# Patient Record
Sex: Female | Born: 1985
Health system: Southern US, Community
[De-identification: ages and names within clinical notes are randomized; demographics above are authoritative.]

## PROBLEM LIST (undated history)

## (undated) DIAGNOSIS — N920 Excessive and frequent menstruation with regular cycle: Secondary | ICD-10-CM

## (undated) DIAGNOSIS — K219 Gastro-esophageal reflux disease without esophagitis: Secondary | ICD-10-CM

## (undated) DIAGNOSIS — N83209 Unspecified ovarian cyst, unspecified side: Secondary | ICD-10-CM

## (undated) DIAGNOSIS — D649 Anemia, unspecified: Secondary | ICD-10-CM

## (undated) DIAGNOSIS — D500 Iron deficiency anemia secondary to blood loss (chronic): Secondary | ICD-10-CM

## (undated) DIAGNOSIS — Z9289 Personal history of other medical treatment: Secondary | ICD-10-CM

## (undated) DIAGNOSIS — D259 Leiomyoma of uterus, unspecified: Secondary | ICD-10-CM

## (undated) HISTORY — DX: Gastro-esophageal reflux disease without esophagitis: K21.9

## (undated) HISTORY — DX: Iron deficiency anemia secondary to blood loss (chronic): D50.0

## (undated) HISTORY — DX: Leiomyoma of uterus, unspecified: D25.9

## (undated) HISTORY — DX: Anemia, unspecified: D64.9

## (undated) HISTORY — DX: Excessive and frequent menstruation with regular cycle: N92.0

---

## 2008-09-04 ENCOUNTER — Emergency Department (HOSPITAL_COMMUNITY): Admission: EM | Admit: 2008-09-04 | Discharge: 2008-09-04 | Payer: Self-pay | Admitting: Emergency Medicine

## 2010-11-27 ENCOUNTER — Emergency Department (HOSPITAL_COMMUNITY)
Admission: EM | Admit: 2010-11-27 | Discharge: 2010-11-27 | Payer: Self-pay | Source: Home / Self Care | Admitting: Family Medicine

## 2010-12-30 ENCOUNTER — Emergency Department (HOSPITAL_COMMUNITY)
Admission: EM | Admit: 2010-12-30 | Discharge: 2010-12-30 | Payer: Self-pay | Source: Home / Self Care | Admitting: Family Medicine

## 2010-12-31 LAB — POCT URINALYSIS DIPSTICK
Bilirubin Urine: NEGATIVE
Hgb urine dipstick: NEGATIVE
Ketones, ur: NEGATIVE mg/dL
Nitrite: NEGATIVE
Protein, ur: NEGATIVE mg/dL
Specific Gravity, Urine: 1.025 (ref 1.005–1.030)
Urine Glucose, Fasting: NEGATIVE mg/dL
Urobilinogen, UA: 1 mg/dL (ref 0.0–1.0)
pH: 7 (ref 5.0–8.0)

## 2010-12-31 LAB — POCT PREGNANCY, URINE: Preg Test, Ur: NEGATIVE

## 2011-02-16 LAB — POCT URINALYSIS DIPSTICK
Bilirubin Urine: NEGATIVE
Glucose, UA: NEGATIVE mg/dL
Specific Gravity, Urine: 1.025 (ref 1.005–1.030)

## 2015-10-20 ENCOUNTER — Encounter (HOSPITAL_COMMUNITY): Payer: Self-pay | Admitting: *Deleted

## 2015-10-20 ENCOUNTER — Emergency Department (INDEPENDENT_AMBULATORY_CARE_PROVIDER_SITE_OTHER)
Admission: EM | Admit: 2015-10-20 | Discharge: 2015-10-20 | Disposition: A | Discharge status: Home / Self Care | Payer: No Typology Code available for payment source | Source: Home / Self Care | Attending: Family Medicine | Admitting: Family Medicine

## 2015-10-20 DIAGNOSIS — N83202 Unspecified ovarian cyst, left side: Secondary | ICD-10-CM | POA: Diagnosis not present

## 2015-10-20 HISTORY — DX: Unspecified ovarian cyst, unspecified side: N83.209

## 2015-10-20 LAB — POCT URINALYSIS DIP (DEVICE)
Bilirubin Urine: NEGATIVE
GLUCOSE, UA: NEGATIVE mg/dL
Hgb urine dipstick: NEGATIVE
Ketones, ur: NEGATIVE mg/dL
Leukocytes, UA: NEGATIVE
NITRITE: NEGATIVE
PH: 7 (ref 5.0–8.0)
PROTEIN: NEGATIVE mg/dL
Specific Gravity, Urine: 1.02 (ref 1.005–1.030)
UROBILINOGEN UA: 1 mg/dL (ref 0.0–1.0)

## 2015-10-20 LAB — POCT PREGNANCY, URINE: PREG TEST UR: NEGATIVE

## 2015-10-20 MED ORDER — HYDROCODONE-ACETAMINOPHEN 5-325 MG PO TABS: 1.0000 | ORAL_TABLET | ORAL | Status: DC | PRN

## 2015-10-20 NOTE — Discharge Instructions (Signed)
Use heat on side and pain med as needed, [facility].

## 2015-10-20 NOTE — ED Notes (Signed)
l   Lower quadrant     Pain        Pain   X   3  Days          denys   Any           Pt  Reports         Discharge

## 2015-10-20 NOTE — ED Provider Notes (Signed)
CSN: [id number]     Arrival date & time 10/20/15  1303 History   First MD Initiated Contact with Patient 10/20/15 1321     Chief Complaint  Patient presents with  . Abdominal Pain   (Consider location/radiation/quality/duration/timing/severity/associated sxs/prior Treatment) Patient is a 29 y.o. female presenting with abdominal pain. The history is provided by the patient.  Abdominal Pain Pain location:  LLQ Pain quality: sharp   Pain radiates to:  Does not radiate Pain severity:  Mild Onset quality:  Sudden Progression:  Unchanged Chronicity:  New Relieved by:  None tried Worsened by:  Nothing tried Ineffective treatments:  None tried Associated symptoms: no diarrhea, no dysuria, no fever, no nausea, no vaginal bleeding, no vaginal discharge and no vomiting   Risk factors comment:  No birth control, h/o ov cyst. nl menses.   Past Medical History  Diagnosis Date  . Ovarian cyst    History reviewed. No pertinent past surgical history. History reviewed. No pertinent family history. Social History  Substance Use Topics  . Smoking status: Current Every Day Smoker  . Smokeless tobacco: None  . Alcohol Use: No   OB History    No data available     Review of Systems  Constitutional: Negative for fever.  Cardiovascular: Negative.   Gastrointestinal: Positive for abdominal pain. Negative for nausea, vomiting and diarrhea.  Genitourinary: Negative.  Negative for dysuria, urgency, frequency, vaginal bleeding and vaginal discharge.  Musculoskeletal: Negative.   All other systems reviewed and are negative.   Allergies  Review of patient's allergies indicates no known allergies.  Home Medications   Prior to Admission medications   Medication Sig Start Date End Date Taking? Authorizing Provider  HYDROcodone-acetaminophen (NORCO/VICODIN) 5-325 MG tablet Take 1 tablet by mouth every 4 (four) hours as needed. 10/20/15   Billy Fischer, MD   Meds Ordered and Administered this  Visit  Medications - No data to display  BP 154/92 mmHg  Pulse 94  Temp(Src) 98.2 F (36.8 C) (Oral)  SpO2 99%  LMP 10/13/2015 No data found.   Physical Exam  Constitutional: She is oriented to person, place, and time. She appears well-developed and well-nourished.  Abdominal: Soft. Normal appearance and bowel sounds are normal. She exhibits no distension and no mass. There is no hepatosplenomegaly or hepatomegaly. There is tenderness in the suprapubic area and left lower quadrant. There is no rebound, no guarding and no CVA tenderness.  Neurological: She is alert and oriented to person, place, and time.  Skin: Skin is warm and dry.  Nursing note and vitals reviewed.   ED Course  Procedures (including critical care time)  Labs Review Labs Reviewed  POCT URINALYSIS DIP (DEVICE)  POCT PREGNANCY, URINE   U/a and preg neg.  Imaging Review No results found.   Visual Acuity Review  Right Eye Distance:   Left Eye Distance:   Bilateral Distance:    Right Eye Near:   Left Eye Near:    Bilateral Near:         MDM   1. Cyst of left ovary       Billy Fischer, MD 10/20/15 1359

## 2018-04-06 DIAGNOSIS — Z9289 Personal history of other medical treatment: Secondary | ICD-10-CM

## 2018-04-06 HISTORY — DX: Personal history of other medical treatment: Z92.89

## 2018-05-01 ENCOUNTER — Observation Stay (HOSPITAL_COMMUNITY)
Admission: EM | Admit: 2018-05-01 | Discharge: 2018-05-02 | Disposition: A | Discharge status: Home / Self Care | Payer: Self-pay | Attending: Family Medicine | Admitting: Family Medicine

## 2018-05-01 ENCOUNTER — Encounter (HOSPITAL_COMMUNITY): Payer: Self-pay | Admitting: Emergency Medicine

## 2018-05-01 ENCOUNTER — Emergency Department (HOSPITAL_COMMUNITY): Payer: Self-pay

## 2018-05-01 DIAGNOSIS — D649 Anemia, unspecified: Secondary | ICD-10-CM

## 2018-05-01 DIAGNOSIS — D259 Leiomyoma of uterus, unspecified: Secondary | ICD-10-CM | POA: Diagnosis present

## 2018-05-01 DIAGNOSIS — E876 Hypokalemia: Secondary | ICD-10-CM | POA: Insufficient documentation

## 2018-05-01 DIAGNOSIS — N858 Other specified noninflammatory disorders of uterus: Secondary | ICD-10-CM | POA: Insufficient documentation

## 2018-05-01 DIAGNOSIS — N859 Noninflammatory disorder of uterus, unspecified: Secondary | ICD-10-CM | POA: Insufficient documentation

## 2018-05-01 DIAGNOSIS — R1032 Left lower quadrant pain: Secondary | ICD-10-CM | POA: Insufficient documentation

## 2018-05-01 DIAGNOSIS — D591 Other autoimmune hemolytic anemias: Principal | ICD-10-CM | POA: Insufficient documentation

## 2018-05-01 DIAGNOSIS — F17200 Nicotine dependence, unspecified, uncomplicated: Secondary | ICD-10-CM | POA: Insufficient documentation

## 2018-05-01 DIAGNOSIS — N920 Excessive and frequent menstruation with regular cycle: Secondary | ICD-10-CM | POA: Insufficient documentation

## 2018-05-01 DIAGNOSIS — N921 Excessive and frequent menstruation with irregular cycle: Secondary | ICD-10-CM

## 2018-05-01 DIAGNOSIS — D509 Iron deficiency anemia, unspecified: Secondary | ICD-10-CM | POA: Insufficient documentation

## 2018-05-01 DIAGNOSIS — D500 Iron deficiency anemia secondary to blood loss (chronic): Secondary | ICD-10-CM | POA: Diagnosis present

## 2018-05-01 LAB — COMPREHENSIVE METABOLIC PANEL
ALBUMIN: 4.4 g/dL (ref 3.5–5.0)
ALT: 8 U/L — AB (ref 14–54)
AST: 13 U/L — AB (ref 15–41)
Alkaline Phosphatase: 83 U/L (ref 38–126)
Anion gap: 11 (ref 5–15)
BILIRUBIN TOTAL: 0.5 mg/dL (ref 0.3–1.2)
BUN: 7 mg/dL (ref 6–20)
CO2: 24 mmol/L (ref 22–32)
CREATININE: 0.84 mg/dL (ref 0.44–1.00)
Calcium: 9.1 mg/dL (ref 8.9–10.3)
Chloride: 105 mmol/L (ref 101–111)
GFR calc Af Amer: >60 mL/min (ref >60)
GFR calc non Af Amer: >60 mL/min (ref >60)
GLUCOSE: 116 mg/dL — AB (ref 65–99)
POTASSIUM: 3 mmol/L — AB (ref 3.5–5.1)
Sodium: 140 mmol/L (ref 135–145)
TOTAL PROTEIN: 7.9 g/dL (ref 6.5–8.1)

## 2018-05-01 LAB — URINALYSIS, ROUTINE W REFLEX MICROSCOPIC
BILIRUBIN URINE: NEGATIVE
GLUCOSE, UA: NEGATIVE mg/dL
Hgb urine dipstick: NEGATIVE
KETONES UR: NEGATIVE mg/dL
Leukocytes, UA: NEGATIVE
Nitrite: NEGATIVE
Protein, ur: NEGATIVE mg/dL
Specific Gravity, Urine: 1.012 (ref 1.005–1.030)
pH: 7 (ref 5.0–8.0)

## 2018-05-01 LAB — I-STAT BETA HCG BLOOD, ED (MC, WL, AP ONLY): I-stat hCG, quantitative: <5 m[IU]/mL (ref <5)

## 2018-05-01 LAB — SAVE SMEAR

## 2018-05-01 LAB — LIPASE, BLOOD: Lipase: 27 U/L (ref 11–51)

## 2018-05-01 LAB — CBC
HCT: 22 % — ABNORMAL LOW (ref 36.0–46.0)
Hemoglobin: 5.9 g/dL — CL (ref 12.0–15.0)
MCH: 17.2 pg — ABNORMAL LOW (ref 26.0–34.0)
MCHC: 26.8 g/dL — AB (ref 30.0–36.0)
MCV: 64 fL — AB (ref 78.0–100.0)
PLATELETS: 221 10*3/uL (ref 150–400)
RBC: 3.44 MIL/uL — ABNORMAL LOW (ref 3.87–5.11)
RDW: 22.2 % — AB (ref 11.5–15.5)
WBC: 6.3 10*3/uL (ref 4.0–10.5)

## 2018-05-01 LAB — PREPARE RBC (CROSSMATCH)

## 2018-05-01 LAB — POC OCCULT BLOOD, ED: Fecal Occult Bld: NEGATIVE

## 2018-05-01 LAB — RETICULOCYTES
RBC.: 3.42 MIL/uL — ABNORMAL LOW (ref 3.87–5.11)
RETIC CT PCT: 1.2 % (ref 0.4–3.1)
Retic Count, Absolute: 41 10*3/uL (ref 19.0–186.0)

## 2018-05-01 MED ORDER — POTASSIUM CHLORIDE CRYS ER 20 MEQ PO TBCR
40.0000 meq | EXTENDED_RELEASE_TABLET | Freq: Once | ORAL | Status: AC
  Administered 2018-05-01: 40 meq via ORAL
  Filled 2018-05-01: qty 2

## 2018-05-01 MED ORDER — SODIUM CHLORIDE 0.9 % IV SOLN: 10.0000 mL/h | Freq: Once | INTRAVENOUS | Status: DC

## 2018-05-01 MED ORDER — IOPAMIDOL (ISOVUE-300) INJECTION 61%
100.0000 mL | Freq: Once | INTRAVENOUS | Status: AC | PRN
  Administered 2018-05-01: 100 mL via INTRAVENOUS

## 2018-05-01 NOTE — ED Triage Notes (Signed)
Pt c/o feeling cough, body aches, cramping for over year. Reports has moss and mold and water leaks in her house and wants to be checked out over those symptoms.  Reports that she ws 180lb last year and since she has been sick now she down to 125lbs.  Reports had miscarriage and never followed up after had that. Reports that she gets recurrent vaginal yeast so believes has ph imbalance.  Hx cyst on ovary.

## 2018-05-01 NOTE — ED Notes (Signed)
Hgb 5.9 

## 2018-05-01 NOTE — H&P (Signed)
History and Physical    Barbara Mcclain HBZ:[id number] DOB: 1986-09-15 DOA: 05/01/2018  Referring MD/NP/PA: Rodell Perna, PA-C PCP: Patient, No Pcp Per  Patient coming from: home  Chief Complaint: Cough  I have personally briefly reviewed patient's old medical records in [place]   HPI: Barbara Mcclain is a 32 y.o. female with medical history significant of ovarian cyst and menorrhagia; who presents with multiple complaints including cough.  Patient states that she has had recurrent colds with a productive cough mostly with clear sputum.  The other day someone noted Moss on her home and therefore came into the emergency department for further evaluation.  She also complains of progressively worsening fatigue and tiredness.  Patient unable to walk any significant distance without being significantly out of breath and tired.  Symptoms seem to wax and wane in intensity.  She notes a history of heavy bleeding with periods.  On the patient's menstrual cycle was approximately 4 days of heavy bleeding.  However, this month and she had 4 days of heavy bleeding, but had additional 4 days of heavy bleeding after couple days of previously stopping which was unusual.  She reports going through a pack of pads every 2 days or so.  She admits to utilizing ibuprofen intermittently.  Associated symptoms include pica, lower abdominal pain with palpation, muscle cramps, weight loss of of 55 pounds in the last year previously weighing 180 pounds now down to around 125.  Patient denies any known family history of malignancy and is not on any iron supplementation.  Patient denies any chest pain, palpitations, loss of consciousness, diarrhea, or dysuria symptoms.  ED Course: Upon admission to the emergency department patient was seen to be afebrile, pulse 84-113, and all other vital signs relatively within normal limits.  Labs revealed WBC 6.3, hemoglobin 5.9 with low MCV and MCH, potassium 3.  Stool guaiac  was negative for any signs of bleeding.  Chest x-ray did not show any acute abnormality.  CT imaging of the abdomen revealed a 10 cm heterogeneous mass of the uterus which malignancy could not be excluded.  Review of Systems  Constitutional: Positive for malaise/fatigue and weight loss.  HENT: Negative for ear discharge and nosebleeds.   Eyes: Negative for photophobia and pain.  Respiratory: Positive for cough, sputum production and shortness of breath.   Cardiovascular: Negative for chest pain and leg swelling.  Gastrointestinal: Positive for abdominal pain. Negative for constipation, nausea and vomiting.  Genitourinary: Negative for dysuria and hematuria.       Positive for vaginal bleeding  Musculoskeletal: Positive for myalgias. Negative for falls.  Neurological: Positive for weakness. Negative for speech change and focal weakness.  Endo/Heme/Allergies:       Positive for Pica    Psychiatric/Behavioral: Negative for substance abuse and suicidal ideas.    Past Medical History:  Diagnosis Date  . Ovarian cyst     History reviewed. No pertinent surgical history.   reports that she has been smoking.  She has never used smokeless tobacco. She reports that she has current or past drug history. Drug: Marijuana. She reports that she does not drink alcohol.  No Known Allergies  Family History  Problem Relation Age of Onset  . CVA Mother   . CVA Father   And reports family history of diabetes.  Prior to Admission medications   Medication Sig Start Date End Date Taking? Authorizing Provider  Carboxymethylcellul-Glycerin (CLEAR EYES FOR DRY EYES OP) Place 1 drop into both eyes daily as  needed (eye discharge).   Yes [provider]  ibuprofen (ADVIL,MOTRIN) 100 MG tablet Take 200 mg by mouth every 6 (six) hours as needed for fever.   Yes [provider]  HYDROcodone-acetaminophen (NORCO/VICODIN) 5-325 MG tablet Take 1 tablet by mouth every 4 (four) hours as  needed. Patient not taking: Reported on 05/01/2018 10/20/15   Billy Fischer, MD    Physical Exam:  Constitutional: Cachectic appearing young female who is otherwise in no acute distress at this time. Vitals:   05/01/18 1805 05/01/18 2112 05/01/18 2209 05/01/18 2230  BP: (!) 153/97 130/76 123/90 118/73  Pulse: (!) 113 91 84 88  Resp: _0 Temp: 99.3 F (37.4 C)     TempSrc: Oral     SpO2: 100% 100% 100% 100%   Eyes: PERRL, lids and conjunctivae normal ENMT: Mucous membranes are dry.  Posterior pharynx clear of any exudate or lesions.poor dentition.  Neck: normal, supple, no masses, no thyromegaly Respiratory: clear to auscultation bilaterally, no wheezing, no crackles. Normal respiratory effort. No accessory muscle use.  Cardiovascular: Regular rate and rhythm, no murmurs / rubs / gallops. No extremity edema. 2+ pedal pulses. No carotid bruits.  Abdomen: Tenderness.  Bowel sounds within normal limits.  To palpation noted suprapubically with mass palpable Musculoskeletal: no clubbing / cyanosis. No joint deformity upper and lower extremities. Good ROM, no contractures. Normal muscle tone.  Skin: Pallor present.  No rashes, lesions, ulcers. No induration Neurologic: CN 2-12 grossly intact. Sensation intact, DTR normal. Strength 5/5 in all 4.  Psychiatric: Normal judgment and insight. Alert and oriented x 3. Normal mood.     Labs on Admission: I have personally reviewed following labs and imaging studies  CBC: Recent Labs  Lab 05/01/18 1820  WBC 6.3  HGB 5.9*  HCT 22.0*  MCV 64.0*  PLT 099   Basic Metabolic Panel: Recent Labs  Lab 05/01/18 1820  NA 140  K 3.0*  CL 105  CO2 24  GLUCOSE 116*  BUN 7  CREATININE 0.84  CALCIUM 9.1   GFR: CrCl cannot be calculated (Unknown ideal weight.). Liver Function Tests: Recent Labs  Lab 05/01/18 1820  AST 13*  ALT 8*  ALKPHOS 83  BILITOT 0.5  PROT 7.9  ALBUMIN 4.4   Recent Labs  Lab 05/01/18 1820  LIPASE 27    No results for input(s): AMMONIA in the last 168 hours. Coagulation Profile: No results for input(s): INR, PROTIME in the last 168 hours. Cardiac Enzymes: No results for input(s): CKTOTAL, CKMB, CKMBINDEX, TROPONINI in the last 168 hours. BNP (last 3 results) No results for input(s): PROBNP in the last 8760 hours. HbA1C: No results for input(s): HGBA1C in the last 72 hours. CBG: No results for input(s): GLUCAP in the last 168 hours. Lipid Profile: No results for input(s): CHOL, HDL, LDLCALC, TRIG, CHOLHDL, LDLDIRECT in the last 72 hours. Thyroid Function Tests: No results for input(s): TSH, T4TOTAL, FREET4, T3FREE, THYROIDAB in the last 72 hours. Anemia Panel: Recent Labs    05/01/18 1820  RETICCTPCT 1.2   Urine analysis:    Component Value Date/Time   COLORURINE YELLOW 05/01/2018 2055   APPEARANCEUR CLEAR 05/01/2018 2055   LABSPEC 1.012 05/01/2018 2055   PHURINE 7.0 05/01/2018 2055   GLUCOSEU NEGATIVE 05/01/2018 2055   HGBUR NEGATIVE 05/01/2018 2055   BILIRUBINUR NEGATIVE 05/01/2018 2055   [place] NEGATIVE 05/01/2018 2055   PROTEINUR NEGATIVE 05/01/2018 2055   UROBILINOGEN 1.0 10/20/2015 1338   NITRITE NEGATIVE 05/01/2018  2055   LEUKOCYTESUR NEGATIVE 05/01/2018 2055   Sepsis Labs: No results found for this or any previous visit (from the past 240 hour(s)).   Radiological Exams on Admission: Dg Chest 2 View  Result Date: 05/01/2018 CLINICAL DATA:  Cough and weight loss EXAM: CHEST - 2 VIEW COMPARISON:  None. FINDINGS: Lungs are clear. Heart size and pulmonary vascularity are normal. No adenopathy. No bone lesions. IMPRESSION: No edema or consolidation. Electronically Signed   By: Lowella Grip III M.D.   On: 05/01/2018 21:03   Ct Abdomen Pelvis W Contrast  Result Date: 05/01/2018 CLINICAL DATA:  Acute onset of vaginal bleeding and generalized fatigue. EXAM: CT ABDOMEN AND PELVIS WITH CONTRAST TECHNIQUE: Multidetector CT imaging of the abdomen and pelvis was  performed using the standard protocol following bolus administration of intravenous contrast. CONTRAST:  153m ISOVUE-300 IOPAMIDOL (ISOVUE-300) INJECTION 61% COMPARISON:  None. FINDINGS: Lower chest: The visualized lung bases are grossly clear. The visualized portions of the mediastinum are unremarkable. Hepatobiliary: Scattered hypodensities within the liver measure up to 6 mm in size. The gallbladder is grossly unremarkable. The common bile duct remains normal in size. Pancreas: The pancreas is within normal limits. Spleen: The spleen is unremarkable in appearance. Adrenals/Urinary Tract: The adrenal glands are unremarkable in appearance. The kidneys are within normal limits. There is no evidence of hydronephrosis. No renal or ureteral stones are identified. No perinephric stranding is seen. Stomach/Bowel: The stomach is unremarkable in appearance. The small bowel is within normal limits. The appendix is normal in caliber, without evidence of appendicitis. The colon is unremarkable in appearance. Vascular/Lymphatic: The abdominal aorta is unremarkable in appearance. The inferior vena cava is grossly unremarkable. No retroperitoneal lymphadenopathy is seen. No pelvic sidewall lymphadenopathy is identified. Reproductive: The bladder is mildly distended and grossly unremarkable. A 10 cm heterogeneous mass is noted within the uterus. Though this could reflect a degenerating fibroid, malignancy cannot be excluded. MRI of the pelvis with contrast would be helpful for further evaluation. Given its size, this would be difficult to assess on ultrasound. No suspicious adnexal masses are seen. The ovaries are relatively symmetric. Other: No additional soft tissue abnormalities are seen. Musculoskeletal: No acute osseous abnormalities are identified. The visualized musculature is unremarkable in appearance. IMPRESSION: 1. 10 cm heterogeneous mass within the uterus. Though this could reflect a degenerating fibroid, malignancy  cannot be excluded. MRI of the pelvis with contrast would be helpful for further evaluation. Given its size, this would be difficult to assess on ultrasound. 2. Scattered nonspecific hypodensities within the liver measure up to 6 mm in size. Would correlate with LFTs. Electronically Signed   By: JGarald BaldingM.D.   On: 05/01/2018 23:15     EKG: Independently reviewed.  Sinus rhythm 85 bpm  Assessment/Plan Symptomatic anemia, suspect iron deficiency, menorrhagia: Acute.  Patient reports having heavy periods, and currently does not take any iron supplementation.  Hemoglobin down to 5.9 on admission.  Patient was typed and screened for possible need of blood products and ordered 2 units of blood. - Admit to a MedSurg bed  - Continue with transfusion of 2 units of PRBCs - Follow-up iron,TIBC, vitamin B12, folate, and other pending studies - Recheck CBC 2 hours after completion of blood - Continue to transfuse if needed.   Uterine mass, weight loss: Acute.  Patient reports having significant weight loss over the last 1 year approximately 60 pounds.  10 cm heterogeneous mass seen within the uterus seen on CT of the abdomen question  possibility of fibroid, but malignancy cannot be ruled out.  MRI of the abdomen/pelvis with contrast recommended - Add on ESR, HIV, TSH, hemoglobin A1c - MRI imaging abdomen/pelvis with contrast ordered - Will likely need referral to OB/GYN   Hypokalemia: Acute.  Initial potassium noted to be 3.  Suspect this could be causing patient's symptoms of possible cramping. Patient given 40 mEq of potassium chloride in ED.   - Continue to monitor and replace potassium as needed   Patient has no primary care provider.  DVT prophylaxis: SCDs Code Status: Full Family Communication: Discussed plan of care with the patient family present bedside Disposition Plan: To be determined Consults called: None Admission status: Observation  Norval Morton MD [facility] Pager [phone]   If 7PM-7AM, please contact night-coverage [email or web address] Password [facility]  05/01/2018, 11:33 PM

## 2018-05-01 NOTE — ED Notes (Signed)
Patient transported to CT 

## 2018-05-01 NOTE — ED Provider Notes (Signed)
[place] DEPT Provider Note   CSN: [id number] Arrival date & time: 05/01/18  1752     History   Chief Complaint Chief Complaint  Patient presents with  . Cough  . Generalized Body Aches  . Abdominal Pain    HPI Barbara Mcclain is a 32 y.o. female with history of ovarian cyst presents for evaluation of multiple complaints.  She states that for the past year or so she has had progressively worsening generalized fatigue.  She states at this point she is unable to walk a block around her house.  She states she has been unable to work due to her fatigue.  She notes shortness of breath specifically with exertion.  She also notes a cough for the past year.  She states that it waxes and wanes in severity and is sometimes productive of clear sputum.  Her boyfriend's mother mentioned that she noticed "moss "growing on top of her apartment and she thinks that she has possibly had an exposure to something that could be causing her symptoms.  She also notes water leaks and "the water smells ".  She also notes chronic intermittent right lower quadrant abdominal pain for the past year or so.  No aggravating or alleviating factors noted.  The pain does not radiate.  She has not tried anything for her pain.  She does note heavy irregular periods.  2 months ago she states "I think I had a miscarriage.  I did not take a pregnancy test but I had a really heavy period that had meat in it ".  She did not seek medical care at this time.  She denies urinary symptoms, melena, hematochezia she also tells me that last year she weighed approximately 180 pounds.  And is currently down to 125 pounds.  She attributes the weight loss to "always feeling sick, not having an appetite, and vomiting sometimes ". She is a former smoker.   The history is provided by the patient.    Past Medical History:  Diagnosis Date  . Ovarian cyst     There are no active problems to display for  this patient.   History reviewed. No pertinent surgical history.   OB History   None      Home Medications    Prior to Admission medications   Medication Sig Start Date End Date Taking? Authorizing Provider  Carboxymethylcellul-Glycerin (CLEAR EYES FOR DRY EYES OP) Place 1 drop into both eyes daily as needed (eye discharge).   Yes [provider]  ibuprofen (ADVIL,MOTRIN) 100 MG tablet Take 200 mg by mouth every 6 (six) hours as needed for fever.   Yes [provider]  HYDROcodone-acetaminophen (NORCO/VICODIN) 5-325 MG tablet Take 1 tablet by mouth every 4 (four) hours as needed. Patient not taking: Reported on 05/01/2018 10/20/15   Billy Fischer, MD    Family History No family history on file.  Social History Social History   Tobacco Use  . Smoking status: Current Every Day Smoker  . Smokeless tobacco: Never Used  Substance Use Topics  . Alcohol use: No  . Drug use: Yes    Types: Marijuana     Allergies   Patient has no known allergies.   Review of Systems Review of Systems  Constitutional: Positive for chills and fatigue. Negative for fever.  Respiratory: Positive for cough and shortness of breath.   Cardiovascular: Negative for chest pain.  Gastrointestinal: Positive for abdominal pain, nausea and vomiting. Negative for blood in  stool, constipation and diarrhea.  Genitourinary: Positive for vaginal bleeding.  Skin: Positive for pallor.  All other systems reviewed and are negative.    Physical Exam Updated Vital Signs BP 119/80 (BP Location: Left Arm)   Pulse 90   Temp 98.6 F (37 C) (Oral)   Resp 15   LMP 04/20/2018   SpO2 100%   Physical Exam  Constitutional: She appears well-developed and well-nourished. No distress.  HENT:  Head: Normocephalic and atraumatic.  Eyes: Conjunctivae are normal. Right eye exhibits no discharge. Left eye exhibits no discharge.  Neck: No JVD present. No tracheal deviation present.  Cardiovascular:  Regular rhythm and intact distal pulses.  Tachycardic  Pulmonary/Chest: Effort normal and breath sounds normal. No stridor. No respiratory distress.  Equal rise and fall of chest, no increased work of breathing, speaking full sentences without difficulty.  Abdominal: Soft. Bowel sounds are normal. She exhibits no distension. There is tenderness in the left lower quadrant. There is no rigidity, no rebound, no guarding, no CVA tenderness, no tenderness at McBurney's point and negative Murphy's sign.  Very mild tenderness to palpation of the left lower quadrant.  Musculoskeletal: She exhibits no edema.  No midline spine TTP, no paraspinal muscle tenderness, no deformity, crepitus, or step-off noted   Neurological: She is alert.  Skin: Skin is warm and dry. No erythema. There is pallor.  Psychiatric: She has a normal mood and affect. Her behavior is normal.  Nursing note and vitals reviewed.    ED Treatments / Results  Labs (all labs ordered are listed, but only abnormal results are displayed) Labs Reviewed  COMPREHENSIVE METABOLIC PANEL - Abnormal; Notable for the following components:      Result Value   Potassium 3.0 (*)    Glucose, Bld 116 (*)    AST 13 (*)    ALT 8 (*)    All other components within normal limits  CBC - Abnormal; Notable for the following components:   RBC 3.44 (*)    Hemoglobin 5.9 (*)    HCT 22.0 (*)    MCV 64.0 (*)    MCH 17.2 (*)    MCHC 26.8 (*)    RDW 22.2 (*)    All other components within normal limits  RETICULOCYTES - Abnormal; Notable for the following components:   RBC. 3.42 (*)    All other components within normal limits  LIPASE, BLOOD  URINALYSIS, ROUTINE W REFLEX MICROSCOPIC  SAVE SMEAR  VITAMIN B12  FOLATE  IRON AND TIBC  FERRITIN  PROTIME-INR  APTT  SEDIMENTATION RATE  MAGNESIUM  I-STAT BETA HCG BLOOD, ED ([facility], [facility], [facility] ONLY)  POC OCCULT BLOOD, ED  TYPE AND SCREEN  ABO/RH  PREPARE RBC (CROSSMATCH)    EKG EKG  Interpretation  Date/Time:  Sunday May 01 2018 21:09:32 EDT Ventricular Rate:  85 PR Interval:    QRS Duration: 78 QT Interval:  366 QTC Calculation: 436 R Axis:   79 Text Interpretation:  Sinus rhythm No STEMI.  Confirmed by Nanda Quinton [phone]) on 05/01/2018 9:35:51 PM   Radiology Dg Chest 2 View  Result Date: 05/01/2018 CLINICAL DATA:  Cough and weight loss EXAM: CHEST - 2 VIEW COMPARISON:  None. FINDINGS: Lungs are clear. Heart size and pulmonary vascularity are normal. No adenopathy. No bone lesions. IMPRESSION: No edema or consolidation. Electronically Signed   By: Lowella Grip III M.D.   On: 05/01/2018 21:03   Ct Abdomen Pelvis W Contrast  Result Date: 05/01/2018 CLINICAL DATA:  Acute  onset of vaginal bleeding and generalized fatigue. EXAM: CT ABDOMEN AND PELVIS WITH CONTRAST TECHNIQUE: Multidetector CT imaging of the abdomen and pelvis was performed using the standard protocol following bolus administration of intravenous contrast. CONTRAST:  159mL ISOVUE-300 IOPAMIDOL (ISOVUE-300) INJECTION 61% COMPARISON:  None. FINDINGS: Lower chest: The visualized lung bases are grossly clear. The visualized portions of the mediastinum are unremarkable. Hepatobiliary: Scattered hypodensities within the liver measure up to 6 mm in size. The gallbladder is grossly unremarkable. The common bile duct remains normal in size. Pancreas: The pancreas is within normal limits. Spleen: The spleen is unremarkable in appearance. Adrenals/Urinary Tract: The adrenal glands are unremarkable in appearance. The kidneys are within normal limits. There is no evidence of hydronephrosis. No renal or ureteral stones are identified. No perinephric stranding is seen. Stomach/Bowel: The stomach is unremarkable in appearance. The small bowel is within normal limits. The appendix is normal in caliber, without evidence of appendicitis. The colon is unremarkable in appearance. Vascular/Lymphatic: The abdominal aorta is  unremarkable in appearance. The inferior vena cava is grossly unremarkable. No retroperitoneal lymphadenopathy is seen. No pelvic sidewall lymphadenopathy is identified. Reproductive: The bladder is mildly distended and grossly unremarkable. A 10 cm heterogeneous mass is noted within the uterus. Though this could reflect a degenerating fibroid, malignancy cannot be excluded. MRI of the pelvis with contrast would be helpful for further evaluation. Given its size, this would be difficult to assess on ultrasound. No suspicious adnexal masses are seen. The ovaries are relatively symmetric. Other: No additional soft tissue abnormalities are seen. Musculoskeletal: No acute osseous abnormalities are identified. The visualized musculature is unremarkable in appearance. IMPRESSION: 1. 10 cm heterogeneous mass within the uterus. Though this could reflect a degenerating fibroid, malignancy cannot be excluded. MRI of the pelvis with contrast would be helpful for further evaluation. Given its size, this would be difficult to assess on ultrasound. 2. Scattered nonspecific hypodensities within the liver measure up to 6 mm in size. Would correlate with LFTs. Electronically Signed   By: Garald Balding M.D.   On: 05/01/2018 23:15    Procedures Procedures (including critical care time)  Medications Ordered in ED Medications  0.9 %  sodium chloride infusion (has no administration in time range)  potassium chloride SA (K-DUR,KLOR-CON) CR tablet 40 mEq (40 mEq Oral Given 05/01/18 2241)  iopamidol (ISOVUE-300) 61 % injection 100 mL (100 mLs Intravenous Contrast Given 05/01/18 2252)     Initial Impression / Assessment and Plan / ED Course  I have reviewed the triage vital signs and the nursing notes.  Pertinent labs & imaging results that were available during my care of the patient were reviewed by me and considered in my medical decision making (see chart for details).     Patient presents for evaluation of multiple  complaints including generalized fatigue, chronic cough, and chronic abdominal pain for 1 year.  She has had significant 60 pound weight loss over the past year.  She is tachycardic and hypertensive initially.  She appears quite pale.  Lab work reviewed by me shows anemia with hemoglobin of 5.9.  This is likely secondary to her heavy periods.  Hemoccult is negative.  Chest x-ray shows no acute cardiopulmonary abnormalities, no evidence of edema or consolidation.  CT of the abdomen and pelvis does show a 10 cm heterogenous mass within the uterus which could be a fibroid but malignancy cannot be excluded.  Patient underwent transfusion of 2 units of packed red blood cells in the ED.  Spoke  with Dr. Tamala Julian with [facility] who agrees to assume care of patient and bring her into the hospital for further evaluation and management.  Patient seen and evaluated by Dr. Laverta Baltimore who agrees with assessment and plan at this time. CRITICAL CARE Performed by: Renita Papa   Total critical care time: 40 minutes  Critical care time was exclusive of separately billable procedures and treating other patients.  Critical care was necessary to treat or prevent imminent or life-threatening deterioration.  Critical care was time spent personally by me on the following activities: development of treatment plan with patient and/or surrogate as well as nursing, discussions with consultants, evaluation of patient's response to treatment, examination of patient, obtaining history from patient or surrogate, ordering and performing treatments and interventions, ordering and review of laboratory studies, ordering and review of radiographic studies, pulse oximetry and re-evaluation of patient's condition.   Final Clinical Impressions(s) / ED Diagnoses   Final diagnoses:  Symptomatic anemia  Uterine mass    ED Discharge Orders    None       Debroah Baller 05/02/18 0036    Margette Fast, MD 05/02/18  [phone]

## 2018-05-01 NOTE — ED Notes (Signed)
ED Provider at bedside. 

## 2018-05-02 ENCOUNTER — Encounter (HOSPITAL_COMMUNITY): Payer: Self-pay | Admitting: Internal Medicine

## 2018-05-02 ENCOUNTER — Observation Stay (HOSPITAL_COMMUNITY): Payer: Self-pay

## 2018-05-02 DIAGNOSIS — E876 Hypokalemia: Secondary | ICD-10-CM | POA: Diagnosis present

## 2018-05-02 DIAGNOSIS — D649 Anemia, unspecified: Secondary | ICD-10-CM | POA: Diagnosis present

## 2018-05-02 DIAGNOSIS — N920 Excessive and frequent menstruation with regular cycle: Secondary | ICD-10-CM | POA: Diagnosis present

## 2018-05-02 DIAGNOSIS — D500 Iron deficiency anemia secondary to blood loss (chronic): Secondary | ICD-10-CM | POA: Diagnosis present

## 2018-05-02 DIAGNOSIS — D259 Leiomyoma of uterus, unspecified: Secondary | ICD-10-CM | POA: Diagnosis present

## 2018-05-02 HISTORY — DX: Iron deficiency anemia secondary to blood loss (chronic): D50.0

## 2018-05-02 HISTORY — DX: Leiomyoma of uterus, unspecified: D25.9

## 2018-05-02 HISTORY — DX: Excessive and frequent menstruation with regular cycle: N92.0

## 2018-05-02 HISTORY — DX: Anemia, unspecified: D64.9

## 2018-05-02 LAB — IRON AND TIBC
Iron: 13 ug/dL — ABNORMAL LOW (ref 28–170)
Saturation Ratios: 3 % — ABNORMAL LOW (ref 10.4–31.8)
TIBC: 386 ug/dL (ref 250–450)
UIBC: 373 ug/dL

## 2018-05-02 LAB — CBC
HEMATOCRIT: 29 % — AB (ref 36.0–46.0)
HEMOGLOBIN: 8.6 g/dL — AB (ref 12.0–15.0)
MCH: 20.8 pg — ABNORMAL LOW (ref 26.0–34.0)
MCHC: 29.7 g/dL — AB (ref 30.0–36.0)
MCV: 70 fL — ABNORMAL LOW (ref 78.0–100.0)
Platelets: 167 10*3/uL (ref 150–400)
RBC: 4.14 MIL/uL (ref 3.87–5.11)
RDW: 24.5 % — ABNORMAL HIGH (ref 11.5–15.5)
WBC: 5 10*3/uL (ref 4.0–10.5)

## 2018-05-02 LAB — PROTIME-INR
INR: 1.08
Prothrombin Time: 13.9 s (ref 11.4–15.2)

## 2018-05-02 LAB — TSH: TSH: 2.4 u[IU]/mL (ref 0.350–4.500)

## 2018-05-02 LAB — HEMOGLOBIN A1C
HEMOGLOBIN A1C: 4.7 % — AB (ref 4.8–5.6)
MEAN PLASMA GLUCOSE: 88.19 mg/dL

## 2018-05-02 LAB — BASIC METABOLIC PANEL
Anion gap: 7 (ref 5–15)
BUN: 6 mg/dL (ref 6–20)
CHLORIDE: 110 mmol/L (ref 101–111)
CO2: 24 mmol/L (ref 22–32)
CREATININE: 0.6 mg/dL (ref 0.44–1.00)
Calcium: 8.8 mg/dL — ABNORMAL LOW (ref 8.9–10.3)
GFR calc non Af Amer: >60 mL/min (ref >60)
Glucose, Bld: 92 mg/dL (ref 65–99)
POTASSIUM: 3.9 mmol/L (ref 3.5–5.1)
Sodium: 141 mmol/L (ref 135–145)

## 2018-05-02 LAB — MAGNESIUM: Magnesium: 2.1 mg/dL (ref 1.7–2.4)

## 2018-05-02 LAB — SEDIMENTATION RATE: Sed Rate: 38 mm/h — ABNORMAL HIGH (ref 0–22)

## 2018-05-02 LAB — VITAMIN B12: Vitamin B-12: 210 pg/mL (ref 180–914)

## 2018-05-02 LAB — APTT: aPTT: 28 s (ref 24–36)

## 2018-05-02 LAB — FOLATE: Folate: 6.9 ng/mL (ref >5.9)

## 2018-05-02 LAB — FERRITIN: Ferritin: 2 ng/mL — ABNORMAL LOW (ref 11–307)

## 2018-05-02 LAB — ABO/RH: ABO/RH(D): A POS

## 2018-05-02 MED ORDER — ALBUTEROL SULFATE (2.5 MG/3ML) 0.083% IN NEBU: 2.5000 mg | INHALATION_SOLUTION | Freq: Four times a day (QID) | RESPIRATORY_TRACT | Status: DC | PRN

## 2018-05-02 MED ORDER — ONDANSETRON HCL 4 MG/2ML IJ SOLN: 4.0000 mg | Freq: Four times a day (QID) | INTRAMUSCULAR | Status: DC | PRN

## 2018-05-02 MED ORDER — ONDANSETRON HCL 4 MG PO TABS: 4.0000 mg | ORAL_TABLET | Freq: Four times a day (QID) | ORAL | Status: DC | PRN

## 2018-05-02 MED ORDER — POLYVINYL ALCOHOL 1.4 % OP SOLN
1.0000 [drp] | Freq: Every day | OPHTHALMIC | Status: DC | PRN
  Filled 2018-05-02: qty 15

## 2018-05-02 MED ORDER — ACETAMINOPHEN 325 MG PO TABS: 650.0000 mg | ORAL_TABLET | Freq: Four times a day (QID) | ORAL | Status: DC | PRN

## 2018-05-02 MED ORDER — FERROUS SULFATE 325 (65 FE) MG PO TABS: 325.0000 mg | ORAL_TABLET | Freq: Three times a day (TID) | ORAL | 0 refills | Status: DC

## 2018-05-02 MED ORDER — ACETAMINOPHEN 650 MG RE SUPP: 650.0000 mg | Freq: Four times a day (QID) | RECTAL | Status: DC | PRN

## 2018-05-02 MED ORDER — GADOBENATE DIMEGLUMINE 529 MG/ML IV SOLN
20.0000 mL | Freq: Once | INTRAVENOUS | Status: AC | PRN
  Administered 2018-05-02: 15 mL via INTRAVENOUS

## 2018-05-02 NOTE — Progress Notes (Signed)
MRI done and case manager spoke with patient, Discharge instructions given , patient verbalized understanding.

## 2018-05-02 NOTE — Progress Notes (Signed)
Spoke with pt. Explained that she will need to call [facility] in am for Hospital follow up appointment. Pt states that she understand and will call. Information on discharge instructions.

## 2018-05-02 NOTE — ED Notes (Addendum)
Both lab and charge RN asked and stated that blood collection for APTT,mag, and PTINR would not be affected by current blood transfusion. Could find no policy stating otherwise.

## 2018-05-02 NOTE — ED Notes (Signed)
No reaction suspected. Pt denies any SOB, back pain, itching or feeling of impending doom.

## 2018-05-02 NOTE — Discharge Summary (Signed)
Physician Discharge Summary  Barbara Mcclain POE:[id number] DOB: January 23, 1986 DOA: 05/01/2018  PCP: Patient, No Pcp Per  Admit date: 05/01/2018 Discharge date: 05/02/2018  Time spent: 36 minutes  1. Please assess blood levels.  2. Ensure patient is taking Iron supplementation as recommended 3. Ensure patient has GYN follow up   Discharge Diagnoses:  Principal Problem:   Symptomatic anemia Active Problems:   Menorrhagia   Iron deficiency anemia due to chronic blood loss   Uterine mass   Hypokalemia   Discharge Condition: stable  Diet recommendation: regular diet  There were no vitals filed for this visit.  History of present illness:  32 y.o. female with medical history significant of ovarian cyst and menorrhagia; who presents with multiple complaints including cough.  Patient states that she has had recurrent colds with a productive cough mostly with clear sputum.  The other day someone noted Moss on her home and therefore came into the emergency department for further evaluation.  She also complains of progressively worsening fatigue and tiredness.   Pt reports being unable to ambulate a block without feeling very tired/fatigued.  Hospital Course:  Symptomatic anemia - combination of iron deficiency and blood loss from menorrhagia - Improved after transfusion - Will discharge with iron supplementation  Uterine mass - have recommended GYN evaluation after hospital discharge. Patient verbalizes agreement and understanding  Hypokalemia - replaced in house and resolved.  Procedures:  Blood transfusion  Consultations:  none  Discharge Exam: Vitals:   05/02/18 0316 05/02/18 0523  BP: 103/74 106/61  Pulse: 89 79  Resp: 20 14  Temp: 98 F (36.7 C) 98.7 F (37.1 C)  SpO2: 100% 100%    General: Pt in nad, alert and awake Cardiovascular: rrr, no rubs Respiratory: no increased wob, no wheezes  Discharge Instructions   Discharge Instructions    Call MD  for:  difficulty breathing, headache or visual disturbances   Complete by:  As directed    Call MD for:  extreme fatigue   Complete by:  As directed    Call MD for:  temperature >100.4   Complete by:  As directed    Diet - low sodium heart healthy   Complete by:  As directed    Discharge instructions   Complete by:  As directed    Please follow-up with your GYN specialist after hospital discharge. Call office for appointment.   Increase activity slowly   Complete by:  As directed      Allergies as of 05/02/2018   No Known Allergies     Medication List    STOP taking these medications   HYDROcodone-acetaminophen 5-325 MG tablet Commonly known as:  NORCO/VICODIN   ibuprofen 100 MG tablet Commonly known as:  ADVIL,MOTRIN     TAKE these medications   CLEAR EYES FOR DRY EYES OP Place 1 drop into both eyes daily as needed (eye discharge).   ferrous sulfate 325 (65 FE) MG tablet Take 1 tablet (325 mg total) by mouth 3 (three) times daily with meals.      No Known Allergies    The results of significant diagnostics from this hospitalization (including imaging, microbiology, ancillary and laboratory) are listed below for reference.    Significant Diagnostic Studies: Dg Chest 2 View  Result Date: 05/01/2018 CLINICAL DATA:  Cough and weight loss EXAM: CHEST - 2 VIEW COMPARISON:  None. FINDINGS: Lungs are clear. Heart size and pulmonary vascularity are normal. No adenopathy. No bone lesions. IMPRESSION: No edema or consolidation. Electronically  Signed   By: Lowella Grip III M.D.   On: 05/01/2018 21:03   Ct Abdomen Pelvis W Contrast  Result Date: 05/01/2018 CLINICAL DATA:  Acute onset of vaginal bleeding and generalized fatigue. EXAM: CT ABDOMEN AND PELVIS WITH CONTRAST TECHNIQUE: Multidetector CT imaging of the abdomen and pelvis was performed using the standard protocol following bolus administration of intravenous contrast. CONTRAST:  134mL ISOVUE-300 IOPAMIDOL  (ISOVUE-300) INJECTION 61% COMPARISON:  None. FINDINGS: Lower chest: The visualized lung bases are grossly clear. The visualized portions of the mediastinum are unremarkable. Hepatobiliary: Scattered hypodensities within the liver measure up to 6 mm in size. The gallbladder is grossly unremarkable. The common bile duct remains normal in size. Pancreas: The pancreas is within normal limits. Spleen: The spleen is unremarkable in appearance. Adrenals/Urinary Tract: The adrenal glands are unremarkable in appearance. The kidneys are within normal limits. There is no evidence of hydronephrosis. No renal or ureteral stones are identified. No perinephric stranding is seen. Stomach/Bowel: The stomach is unremarkable in appearance. The small bowel is within normal limits. The appendix is normal in caliber, without evidence of appendicitis. The colon is unremarkable in appearance. Vascular/Lymphatic: The abdominal aorta is unremarkable in appearance. The inferior vena cava is grossly unremarkable. No retroperitoneal lymphadenopathy is seen. No pelvic sidewall lymphadenopathy is identified. Reproductive: The bladder is mildly distended and grossly unremarkable. A 10 cm heterogeneous mass is noted within the uterus. Though this could reflect a degenerating fibroid, malignancy cannot be excluded. MRI of the pelvis with contrast would be helpful for further evaluation. Given its size, this would be difficult to assess on ultrasound. No suspicious adnexal masses are seen. The ovaries are relatively symmetric. Other: No additional soft tissue abnormalities are seen. Musculoskeletal: No acute osseous abnormalities are identified. The visualized musculature is unremarkable in appearance. IMPRESSION: 1. 10 cm heterogeneous mass within the uterus. Though this could reflect a degenerating fibroid, malignancy cannot be excluded. MRI of the pelvis with contrast would be helpful for further evaluation. Given its size, this would be  difficult to assess on ultrasound. 2. Scattered nonspecific hypodensities within the liver measure up to 6 mm in size. Would correlate with LFTs. Electronically Signed   By: Garald Balding M.D.   On: 05/01/2018 23:15    Microbiology: No results found for this or any previous visit (from the past 240 hour(s)).   Labs: Basic Metabolic Panel: Recent Labs  Lab 05/01/18 1820 05/02/18 0810  NA 140 141  K 3.0* 3.9  CL 105 110  CO2 24 24  GLUCOSE 116* 92  BUN 7 6  CREATININE 0.84 0.60  CALCIUM 9.1 8.8*  MG  --  2.1   Liver Function Tests: Recent Labs  Lab 05/01/18 1820  AST 13*  ALT 8*  ALKPHOS 83  BILITOT 0.5  PROT 7.9  ALBUMIN 4.4   Recent Labs  Lab 05/01/18 1820  LIPASE 27   No results for input(s): AMMONIA in the last 168 hours. CBC: Recent Labs  Lab 05/01/18 1820 05/02/18 0810  WBC 6.3 5.0  HGB 5.9* 8.6*  HCT 22.0* 29.0*  MCV 64.0* 70.0*  PLT 221 167   Cardiac Enzymes: No results for input(s): CKTOTAL, CKMB, CKMBINDEX, TROPONINI in the last 168 hours. BNP: BNP (last 3 results) No results for input(s): BNP in the last 8760 hours.  ProBNP (last 3 results) No results for input(s): PROBNP in the last 8760 hours.  CBG: No results for input(s): GLUCAP in the last 168 hours.   Signed:  Linward Foster  Wendee Beavers MD.  [facility] 05/02/2018, 2:43 PM

## 2018-05-02 NOTE — Plan of Care (Signed)
  Problem: Safety: Goal: Ability to remain free from injury will improve Outcome: Completed/Met

## 2018-05-04 LAB — HIV ANTIBODY (ROUTINE TESTING W REFLEX): HIV Screen 4th Generation wRfx: NONREACTIVE

## 2018-05-05 LAB — TYPE AND SCREEN
ABO/RH(D): A POS
Antibody Screen: NEGATIVE
Unit division: 0
Unit division: 0
Unit division: 0
Unit division: 0

## 2018-05-05 LAB — BPAM RBC
Blood Product Expiration Date: 201906092359
Blood Product Expiration Date: 201906092359
Blood Product Expiration Date: 201906092359
Blood Product Expiration Date: 201906092359
ISSUE DATE / TIME: 201905262338
ISSUE DATE / TIME: 201905270250
Unit Type and Rh: 6200
Unit Type and Rh: 6200
Unit Type and Rh: 6200
Unit Type and Rh: 6200

## 2018-05-13 ENCOUNTER — Ambulatory Visit: Payer: Self-pay | Attending: Family Medicine | Admitting: Physician Assistant

## 2018-05-13 VITALS — BP 97/64 | HR 85 | Temp 98.2°F | Wt 129.8 lb

## 2018-05-13 DIAGNOSIS — N920 Excessive and frequent menstruation with regular cycle: Secondary | ICD-10-CM | POA: Insufficient documentation

## 2018-05-13 DIAGNOSIS — Z09 Encounter for follow-up examination after completed treatment for conditions other than malignant neoplasm: Secondary | ICD-10-CM | POA: Insufficient documentation

## 2018-05-13 DIAGNOSIS — F17210 Nicotine dependence, cigarettes, uncomplicated: Secondary | ICD-10-CM | POA: Insufficient documentation

## 2018-05-13 DIAGNOSIS — D649 Anemia, unspecified: Secondary | ICD-10-CM | POA: Insufficient documentation

## 2018-05-13 DIAGNOSIS — K769 Liver disease, unspecified: Secondary | ICD-10-CM | POA: Insufficient documentation

## 2018-05-13 DIAGNOSIS — N858 Other specified noninflammatory disorders of uterus: Secondary | ICD-10-CM

## 2018-05-13 DIAGNOSIS — N859 Noninflammatory disorder of uterus, unspecified: Secondary | ICD-10-CM | POA: Insufficient documentation

## 2018-05-13 DIAGNOSIS — N921 Excessive and frequent menstruation with irregular cycle: Secondary | ICD-10-CM

## 2018-05-13 MED ORDER — FERROUS SULFATE 325 (65 FE) MG PO TABS: 325.0000 mg | ORAL_TABLET | Freq: Three times a day (TID) | ORAL | 1 refills | Status: DC

## 2018-05-13 NOTE — Progress Notes (Signed)
Barbara Mcclain  IRW:[id number]  PYP:[id number]  DOB - 1985/12/10  Chief Complaint  Patient presents with  . Hospitalization Follow-up       Subjective:   Barbara Mcclain is a 32 y.o. female here today for establishment of care.  Consistent heavy menstrual cycles.  She presented to the hospital on 05/01/2018 with complaints of cough, fatigue, exertional shortness of breath, heavy periods unexplained, unintentional weight loss.  He further believes that 2 months ago she may have had a miscarriage.  She really is unsure about this.  She is been experiencing lower abdominal pain.  Intensive on arrival.  Her hemoglobin was 5.9.  Her MCV and MCH were low.  Potassium 3.0 back negative.  Chest x-ray normal.  A CT scan of the abdomen showed a 10 cm mass in the uterus was scattered densities within the liver up to 6 mm MRI imaging was recommended and completed suggesting that the mass was a fibroid tumor.  She was admitted by the internal medicine team and is status post 2 units of blood.  She is now on iron 3 times daily.  Hemoglobin at discharge 8.6.  Since discharge no passing of clots vaginally.  Her first day of her last menstrual period was 04/14/2018 or somewhere around there.  She has been coompliant with the iron tablets.  Occasionally smokes cigarettes.   ROS: GEN: denies fever or chills, +change in weight Skin: denies lesions or rashes HEENT: denies headache, earache, epistaxis, sore throat, or neck pain LUNGS: denies SHOB, dyspnea, PND, orthopnea CV: denies CP or palpitations ABD:+ abd pain, N or V EXT: denies muscle spasms or swelling; no pain in lower ext, no weakness NEURO: denies numbness or tingling, denies sz, stroke or TIA  Problem  Hypokalemia (Resolved)    ALLERGIES: No Known Allergies  PAST MEDICAL HISTORY: Past Medical History:  Diagnosis Date  . Ovarian cyst     PAST SURGICAL HISTORY: History reviewed. No pertinent surgical history.  MEDICATIONS AT  HOME: Prior to Admission medications   Medication Sig Start Date End Date Taking? Authorizing Provider  ferrous sulfate 325 (65 FE) MG tablet Take 1 tablet (325 mg total) by mouth 3 (three) times daily with meals. 05/13/18 05/13/19 Yes Cheryln Balcom S, PA-C  Carboxymethylcellul-Glycerin (CLEAR EYES FOR DRY EYES OP) Place 1 drop into both eyes daily as needed (eye discharge).    [provider]    Family History  Problem Relation Age of Onset  . CVA Mother   . CVA Father     Social-unmarried, no children, not working, occasionally smokes  Objective:   Vitals:   05/13/18 1046  BP: 97/64  Pulse: 85  Temp: 98.2 F (36.8 C)  TempSrc: Oral  SpO2: 99%  Weight: 129 lb 12.8 oz (58.9 kg)    Exam General appearance : Awake, alert, not in any distress. Speech Clear. Not toxic looking Neck: supple, no JVD. No cervical lymphadenopathy.  Chest:Good air entry bilaterally, no added sounds  CVS: S1 S2 regular, no murmurs.  Abdomen: Bowel sounds present,lower abd firm, tender Extremities: B/L Lower Ext shows no edema, both legs are warm to touch Neurology: Awake alert, and oriented X 3, CN II-XII intact, Non focal   Data Review Lab Results  Component Value Date   HGBA1C 4.7 (L) 05/02/2018    Assessment & Plan  1. Menorrhagia/Metrorrhagia  -GYN referral  2. SX Anemia  -CBC toda   -cont FeSO4 TID with Vit C  3. Smoker  -cessation discussed  4. Uterine Mass suggestive of fibroid  -GYN referral  Return in about 1 month (around 06/10/2018).  She also needs to see the financial counselor.  The patient was given clear instructions to go to ER or return to medical center if symptoms don't improve, worsen or new problems develop. The patient verbalized understanding. The patient was told to call to get lab results if they haven't heard anything in the next week.   Total time spent with patient was 31 min. Greater than 50 % of this visit was spent face to face counseling and  coordinating care regarding risk factor modification, compliance importance and encouragement, education related to hospitalization review.  This note has been created with Surveyor, quantity. Any transcriptional errors are unintentional.    Zettie Pho, PA-C [facility] [place], [place]   05/13/2018, 11:02 AM

## 2018-05-14 LAB — CBC WITH DIFFERENTIAL/PLATELET
BASOS ABS: 0 10*3/uL (ref 0.0–0.2)
BASOS: 0 %
EOS (ABSOLUTE): 0.1 10*3/uL (ref 0.0–0.4)
EOS: 1 %
HEMATOCRIT: 35 % (ref 34.0–46.6)
HEMOGLOBIN: 10.4 g/dL — AB (ref 11.1–15.9)
IMMATURE GRANS (ABS): 0 10*3/uL (ref 0.0–0.1)
Immature Granulocytes: 0 %
LYMPHS ABS: 1.5 10*3/uL (ref 0.7–3.1)
LYMPHS: 16 %
MCH: 23.8 pg — ABNORMAL LOW (ref 26.6–33.0)
MCHC: 29.7 g/dL — ABNORMAL LOW (ref 31.5–35.7)
MCV: 80 fL (ref 79–97)
MONOCYTES: 4 %
Monocytes Absolute: 0.4 10*3/uL (ref 0.1–0.9)
NEUTROS ABS: 7.3 10*3/uL — AB (ref 1.4–7.0)
Neutrophils: 79 %
PLATELETS: 668 10*3/uL — AB (ref 150–450)
RBC: 4.37 x10E6/uL (ref 3.77–5.28)
RDW: 32.8 % — ABNORMAL HIGH (ref 12.3–15.4)
WBC: 9.3 10*3/uL (ref 3.4–10.8)

## 2018-05-17 ENCOUNTER — Telehealth: Payer: Self-pay | Admitting: *Deleted

## 2018-05-17 NOTE — Telephone Encounter (Signed)
Notes recorded by Carilyn Goodpasture, RN on 05/17/2018 at 4:16 PM EDT Left message on voicemail for patient to return call ------  Notes recorded by Brayton Caves, PA-C on 05/16/2018 at 8:05 AM EDT Pls let her know that her hemoglobin is continuing to improve, 10.8. Keep taking Iron with a splash of vitamin c. Thanks.

## 2018-06-10 ENCOUNTER — Encounter: Payer: Self-pay | Admitting: Nurse Practitioner

## 2018-06-10 ENCOUNTER — Ambulatory Visit: Payer: Medicaid Other | Attending: Nurse Practitioner | Admitting: Nurse Practitioner

## 2018-06-10 VITALS — BP 122/81 | HR 80 | Temp 98.6°F | Ht 67.0 in | Wt 130.8 lb

## 2018-06-10 DIAGNOSIS — N921 Excessive and frequent menstruation with irregular cycle: Secondary | ICD-10-CM

## 2018-06-10 DIAGNOSIS — Z823 Family history of stroke: Secondary | ICD-10-CM | POA: Insufficient documentation

## 2018-06-10 DIAGNOSIS — N83209 Unspecified ovarian cyst, unspecified side: Secondary | ICD-10-CM | POA: Insufficient documentation

## 2018-06-10 DIAGNOSIS — Z833 Family history of diabetes mellitus: Secondary | ICD-10-CM | POA: Insufficient documentation

## 2018-06-10 DIAGNOSIS — Z79899 Other long term (current) drug therapy: Secondary | ICD-10-CM | POA: Insufficient documentation

## 2018-06-10 DIAGNOSIS — Z8249 Family history of ischemic heart disease and other diseases of the circulatory system: Secondary | ICD-10-CM | POA: Insufficient documentation

## 2018-06-10 DIAGNOSIS — N920 Excessive and frequent menstruation with regular cycle: Secondary | ICD-10-CM | POA: Insufficient documentation

## 2018-06-10 DIAGNOSIS — D259 Leiomyoma of uterus, unspecified: Secondary | ICD-10-CM | POA: Insufficient documentation

## 2018-06-10 DIAGNOSIS — D509 Iron deficiency anemia, unspecified: Secondary | ICD-10-CM | POA: Insufficient documentation

## 2018-06-10 MED ORDER — FERROUS SULFATE 325 (65 FE) MG PO TABS: 325.0000 mg | ORAL_TABLET | Freq: Three times a day (TID) | ORAL | 1 refills | Status: DC

## 2018-06-10 MED FILL — FERROUS SULFATE 325 MG TAB: 325 (65 FE) | 30 days supply | Qty: 90 | Fill #0

## 2018-06-10 NOTE — Patient Instructions (Signed)
Dysfunctional Uterine Bleeding Dysfunctional uterine bleeding is abnormal bleeding from the uterus. Dysfunctional uterine bleeding includes:  A period that comes earlier or later than usual.  A period that is lighter, heavier, or has blood clots.  Bleeding between periods.  Skipping one or more periods.  Bleeding after sexual intercourse.  Bleeding after menopause.  Follow these instructions at home: Pay attention to any changes in your symptoms. Follow these instructions to help with your condition: Eating and drinking  Eat well-balanced meals. Include foods that are high in iron, such as liver, meat, shellfish, green leafy vegetables, and eggs.  If you become constipated: ? Drink plenty of water. ? Eat fruits and vegetables that are high in water and fiber, such as spinach, carrots, raspberries, apples, and mango. Medicines  Take over-the-counter and prescription medicines only as told by your health care provider.  Do not change medicines without talking with your health care provider.  Aspirin or medicines that contain aspirin may make the bleeding worse. Do not take those medicines: ? During the week before your period. ? During your period.  If you were prescribed iron pills, take them as told by your health care provider. Iron pills help to replace iron that your body loses because of this condition. Activity  If you need to change your sanitary pad or tampon more than one time every 2 hours: ? Lie in bed with your feet raised (elevated). ? Place a cold pack on your lower abdomen. ? Rest as much as possible until the bleeding stops or slows down.  Do not try to lose weight until the bleeding has stopped and your blood iron level is back to normal. Other Instructions  For two months, write down: ? When your period starts. ? When your period ends. ? When any abnormal bleeding occurs. ? What problems you notice.  Keep all follow up visits as told by your health  care provider. This is important. Contact a health care provider if:  You get light-headed or weak.  You have nausea and vomiting.  You cannot eat or drink without vomiting.  You feel dizzy or have diarrhea while you are taking medicines.  You are taking birth control pills or hormones, and you want to change them or stop taking them. Get help right away if:  You develop a fever or chills.  You need to change your sanitary pad or tampon more than one time per hour.  Your bleeding becomes heavier, or your flow contains clots more often.  You develop pain in your abdomen.  You lose consciousness.  You develop a rash. This information is not intended to replace advice given to you by your health care provider. Make sure you discuss any questions you have with your health care provider. Document Released: 11/20/2000 Document Revised: 04/30/2016 Document Reviewed: 02/18/2015 Elsevier Interactive Patient Education  2018 [employer].  Uterine Fibroids Uterine fibroids are tissue masses (tumors). They are also called leiomyomas. They can develop inside of a woman's womb (uterus). They can grow very large. Fibroids are not cancerous (benign). Most fibroids do not require medical treatment. Follow these instructions at home:  Keep all follow-up visits as told by your doctor. This is important.  Take medicines only as told by your doctor. ? If you were prescribed a hormone treatment, take the hormone medicines exactly as told. ? Do not take aspirin. It can cause bleeding.  Ask your doctor about taking iron pills and increasing the amount of dark green, leafy  vegetables in your diet. These actions can help to boost your blood iron levels.  Pay close attention to your period. Tell your doctor about any changes, such as: ? Increased blood flow. This may require you to use more pads or tampons than usual per month. ? A change in the number of days that your period lasts per month. ? A  change in symptoms that come with your period, such as back pain or cramping in your belly area (abdomen). Contact a doctor if:  You have pain in your back or the area between your hip bones (pelvic area) that is not controlled by medicines.  You have pain in your abdomen that is not controlled with medicines.  You have an increase in bleeding between and during periods.  You soak tampons or pads in a half hour or less.  You feel lightheaded.  You feel extra tired.  You feel weak. Get help right away if:  You pass out (faint).  You have a sudden increase in pelvic pain. This information is not intended to replace advice given to you by your health care provider. Make sure you discuss any questions you have with your health care provider. Document Released: 12/26/2010 Document Revised: 07/24/2016 Document Reviewed: 05/22/2014 Elsevier Interactive Patient Education  [employer].

## 2018-06-10 NOTE — Progress Notes (Signed)
Assessment & Plan:  Barbara Mcclain was seen today for establish care.  Diagnoses and all orders for this visit:  Iron deficiency anemia, unspecified iron deficiency anemia type -     ferrous sulfate 325 (65 FE) MG tablet; Take 1 tablet (325 mg total) by mouth 3 (three) times daily with meals. -     CBC  Menorrhagia with irregular cycle Uterine fibroid Awaiting GYN f/u  Patient has been counseled on age-appropriate routine health concerns for screening and prevention. These are reviewed and up-to-date. Referrals have been placed accordingly. Immunizations are up-to-date or declined.    Subjective:   Chief Complaint  Patient presents with  . Establish Care    Pt. is here to establish care for primary care.    HPI Barbara Mcclain 32 y.o. female presents to office today to establish care. She has a history of IDA (requring recent blood transfusion x2), menorrhagia with metrorrhagia, large uterine fibroid, as well as ovarian cysts.  Currently taking ferrous sulfate TID. She is an infrequent smoker. She has been referred to GYN however she needs to apply for financial assistance through the financial counselor here in the clinic. She has an appt. Next week.   Anemia: Patient presents for presents evaluation of anemia. Anemia was found by ER visit.  It has been present for unknown length of time. Associated signs & symptoms: fatigue and pallor.  Dysmenorrhea/ Premenstrual Syndrome: Patient describes symptoms of  menorrhagia (moderate). Symptoms occur with periods, which are irregular: Patient denies decreased libido, depression and insomnia. Evaluation to date includes abnormal MRI pelvis. Treatment to date includes nothing. The patient is sexually active.    Review of Systems  Constitutional: Positive for malaise/fatigue. Negative for fever and weight loss.  HENT: Negative.  Negative for nosebleeds.   Eyes: Negative.  Negative for blurred vision, double vision and photophobia.    Respiratory: Negative.  Negative for cough and shortness of breath.   Cardiovascular: Negative.  Negative for chest pain, palpitations and leg swelling.  Gastrointestinal: Negative.  Negative for heartburn, nausea and vomiting.  Genitourinary:       SEE HPI  Musculoskeletal: Negative.  Negative for myalgias.  Neurological: Negative.  Negative for dizziness, focal weakness, seizures and headaches.  Psychiatric/Behavioral: Negative.  Negative for suicidal ideas.    Past Medical History:  Diagnosis Date  . Ovarian cyst     History reviewed. No pertinent surgical history.  Family History  Problem Relation Age of Onset  . CVA Father   . Diabetes Maternal Grandmother   . Hypertension Maternal Grandmother     Social History Reviewed with no changes to be made today.   Outpatient Medications Prior to Visit  Medication Sig Dispense Refill  . Carboxymethylcellul-Glycerin (CLEAR EYES FOR DRY EYES OP) Place 1 drop into both eyes daily as needed (eye discharge).    . ferrous sulfate 325 (65 FE) MG tablet Take 1 tablet (325 mg total) by mouth 3 (three) times daily with meals. 90 tablet 1   No facility-administered medications prior to visit.     No Known Allergies     Objective:    BP 122/81 (BP Location: Left Arm, Patient Position: Sitting, Cuff Size: Normal)   Pulse 80   Temp 98.6 F (37 C) (Oral)   Ht 5\' 7"  (1.702 m)   Wt 130 lb 12.8 oz (59.3 kg)   LMP 05/30/2018   SpO2 97%   BMI 20.49 kg/m  Wt Readings from Last 3 Encounters:  06/10/18 130 lb 12.8  oz (59.3 kg)  05/13/18 129 lb 12.8 oz (58.9 kg)    Physical Exam  Constitutional: She is oriented to person, place, and time. She appears well-developed and well-nourished. She is cooperative.  HENT:  Head: Normocephalic and atraumatic.  Eyes: EOM are normal.  Neck: Normal range of motion.  Cardiovascular: Normal rate, regular rhythm, normal heart sounds and intact distal pulses. Exam reveals no gallop and no friction  rub.  No murmur heard. Pulmonary/Chest: Effort normal and breath sounds normal. No tachypnea. No respiratory distress. She has no decreased breath sounds. She has no wheezes. She has no rhonchi. She has no rales. She exhibits no tenderness.  Abdominal: Bowel sounds are normal.  Musculoskeletal: Normal range of motion. She exhibits no edema.  Neurological: She is alert and oriented to person, place, and time. Coordination normal.  Skin: Skin is warm and dry.  Psychiatric: She has a normal mood and affect. Her behavior is normal. Judgment and thought content normal.  Nursing note and vitals reviewed.        Patient has been counseled extensively about nutrition and exercise as well as the importance of adherence with medications and regular follow-up. The patient was given clear instructions to go to ER or return to medical center if symptoms don't improve, worsen or new problems develop. The patient verbalized understanding.   Follow-up: Return in about 6 weeks (around 07/22/2018) for PAP SMEAR.   Gildardo Pounds, FNP-BC [facility] [place], [place]   06/10/2018, 11:48 AM

## 2018-06-11 LAB — CBC
Hematocrit: 36.2 % (ref 34.0–46.6)
Hemoglobin: 11.7 g/dL (ref 11.1–15.9)
MCH: 28.4 pg (ref 26.6–33.0)
MCHC: 32.3 g/dL (ref 31.5–35.7)
MCV: 88 fL (ref 79–97)
Platelets: 376 10*3/uL (ref 150–450)
RBC: 4.12 x10E6/uL (ref 3.77–5.28)
RDW: 27.1 % — AB (ref 12.3–15.4)
WBC: 4.1 10*3/uL (ref 3.4–10.8)

## 2018-06-15 ENCOUNTER — Ambulatory Visit: Payer: Medicaid Other

## 2018-07-12 ENCOUNTER — Encounter: Payer: Self-pay | Admitting: Family Medicine

## 2018-07-22 ENCOUNTER — Ambulatory Visit: Payer: Self-pay | Attending: Nurse Practitioner | Admitting: Nurse Practitioner

## 2018-07-22 ENCOUNTER — Ambulatory Visit: Payer: Self-pay

## 2018-07-22 ENCOUNTER — Encounter: Payer: Self-pay | Admitting: Nurse Practitioner

## 2018-07-22 VITALS — BP 119/81 | HR 84 | Temp 99.3°F | Ht 67.0 in | Wt 138.6 lb

## 2018-07-22 DIAGNOSIS — Z01419 Encounter for gynecological examination (general) (routine) without abnormal findings: Secondary | ICD-10-CM | POA: Insufficient documentation

## 2018-07-22 DIAGNOSIS — Z823 Family history of stroke: Secondary | ICD-10-CM | POA: Insufficient documentation

## 2018-07-22 DIAGNOSIS — Z124 Encounter for screening for malignant neoplasm of cervix: Secondary | ICD-10-CM

## 2018-07-22 NOTE — Patient Instructions (Signed)
Pap Test Why am I having this test? A pap test is sometimes called a pap smear. It is a screening test that is used to check for signs of cancer of the vagina, cervix, and uterus. The test can also identify the presence of infection or precancerous changes. Your health care provider will likely recommend you have this test done on a regular basis. This test may be done:  Every 3 years, starting at age 34.  Every 5 years, in combination with testing for the presence of human papillomavirus (HPV).  More or less often depending on other medical conditions.  What kind of sample is taken? Using a small cotton swab, plastic spatula, or brush, your health care provider will collect a sample of cells from the surface of your cervix. Your cervix is the opening to your uterus, also called a womb. Secretions from the cervix and vagina may also be collected. How do I prepare for this test?  Be aware of where you are in your menstrual cycle. You may be asked to reschedule the test if you are menstruating on the day of the test.  You may need to reschedule if you have a known vaginal infection on the day of the test.  You may be asked to avoid douching or taking a bath the day before or the day of the test.  Some medicines can cause abnormal test results, such as digitalis and tetracycline. Talk with your health care provider before your test if you take one of these medicines. What do the results mean? Abnormal test results may indicate a number of health conditions. These may include:  Cancer. Although pap test results cannot be used to diagnose cancer of the cervix, vagina, or uterus, they may suggest the possibility of cancer. Further tests would be required to determine if cancer is present.  Sexually transmitted disease.  Fungal infection.  Parasite infection.  Herpes infection.  A condition causing or contributing to infertility.  It is your responsibility to obtain your test results.  Ask the lab or department performing the test when and how you will get your results. Contact your health care provider to discuss any questions you have about your results. Talk with your health care provider to discuss your results, treatment options, and if necessary, the need for more tests. Talk with your health care provider if you have any questions about your results. This information is not intended to replace advice given to you by your health care provider. Make sure you discuss any questions you have with your health care provider. Document Released: 02/13/2003 Document Revised: 07/29/2016 Document Reviewed: 04/16/2014 Elsevier Interactive Patient Education  2018 Elsevier Inc.  Preventing Cervical Cancer Cervical cancer is cancer that grows on the cervix. The cervix is at the bottom of the uterus. It connects the uterus to the vagina. The uterus is where a baby develops during pregnancy. Cancer occurs when cells become abnormal and start to grow out of control. Cervical cancer grows slowly and may not cause any symptoms at first. Over time, the cancer can grow deep into the cervix tissue and spread to other areas. If it is found early, cervical cancer can be treated effectively. You can also take steps to prevent this type of cancer. Most cases of cervical cancer are caused by an STI (sexually transmitted infection) called human papillomavirus (HPV). One way to reduce your risk of cervical cancer is to avoid infection with the HPV virus. You can do this by practicing safe  sex and by getting the HPV vaccine. Getting regular Pap tests is also important because this can help identify changes in cells that could lead to cancer. Your chances of getting this disease can also be reduced by making certain lifestyle changes. How can I protect myself from cervical cancer? Preventing HPV infection  Ask your health care provider about getting the HPV vaccine. If you are 25 years old or younger, you may  need to get this vaccine, which is given in three doses over 6 months. This vaccine protects against the types of HPV that could cause cancer.  Limit the number of people you have sex with. Also avoid having sex with people who have had many sex partners.  Use a latex condom during sex. Getting Pap tests  Get Pap tests regularly, starting at age 45. Talk with your health care provider about how often you need these tests. ? Most women who are 30?34 years of age should have a Pap test every 3 years. ? Most women who are 60?34 years of age should have a Pap test in combination with an HPV test every 5 years. ? Women with a higher risk of cervical cancer, such as those with a weakened immune system or those who have been exposed to the drug diethylstilbestrol (DES), may need more frequent testing. Making other lifestyle changes  Do not use any products that contain nicotine or tobacco, such as cigarettes and e-cigarettes. If you need help quitting, ask your health care provider.  Eat at least 5 servings of fruits and vegetables every day.  Lose weight if you are overweight. Why are these changes important?  These changes and screening tests are designed to address the factors that are known to increase the risk of cervical cancer. Taking these steps is the best way to reduce your risk.  Having regular Pap tests will help identify changes in cells that could lead to cancer. Steps can then be taken to prevent cancer from developing.  These changes will also help find cervical cancer early. This type of cancer can be treated effectively if it is found early. It can be more dangerous and difficult to treat if cancer has grown deep into your cervix or has spread.  In addition to making you less likely to get cervical cancer, these changes will also provide other health benefits, such as the following: ? Practicing safe sex is important for preventing STIs and unplanned pregnancies. ? Avoiding  tobacco can reduce your risk for other cancers and health issues. ? Eating a healthy diet and maintaining a healthy weight are good for your overall health. What can happen if changes are not made? In the early stages, cervical cancer might not have any symptoms. It can take many years for the cancer to grow and get deep into the cervix tissue. This may be happening without you knowing about it. If you develop any symptoms, such as pelvic pain or unusual discharge or bleeding from your vagina, you should see your health care provider right away. If cervical cancer is not found early, you might need treatments such as radiation, chemotherapy, or surgery. In some cases, surgery may mean that you will not be able to get pregnant or carry a pregnancy to term. Where to find support: Talk with your health care provider, school nurse, or local health department for guidance about screening and vaccination. Some children and teens may be able to get the HPV vaccine free of charge through the U.[place]. government's  Vaccines for Children [facility]) program. Other places that provide vaccinations include:  Public health clinics. Check with your local health department.  [place], where you would pay only what you can afford. To find one near you, check this website: http://lyons.com/  [place]. These are part of a program for Medicare and Medicaid patients who live in rural areas.  The National Breast and Cervical Cancer Early Detection Program also provides breast and cervical cancer screenings and diagnostic services to low-income, uninsured, and underinsured women. Cervical cancer can be passed down through families. Talk with your health care provider or genetic counselor to learn more about genetic testing for cancer. Where to find more information: Learn more about cervical cancer from:  [employer]:  WirelessShades.ch  American Cancer Society: www.cancer.org/cancer/cervicalcancer/  [place]. Centers for Disease Control and Prevention: ParisianParasols.gl  Summary  Talk with your health care provider about getting the HPV vaccine.  Be sure to get regular Pap tests as recommended by your health care provider.  See your health care provider right away if you have any pelvic pain or unusual discharge or bleeding from your vagina. This information is not intended to replace advice given to you by your health care provider. Make sure you discuss any questions you have with your health care provider. Document Released: 12/08/2015 Document Revised: 07/21/2016 Document Reviewed: 07/21/2016 Elsevier Interactive Patient Education  [employer].

## 2018-07-22 NOTE — Progress Notes (Addendum)
Assessment & Plan:  Barbara Mcclain was seen today for gynecologic exam.  Diagnoses and all orders for this visit:  Encounter for Papanicolaou smear for cervical cancer screening -     Cytology - PAP    Patient has been counseled on age-appropriate routine health concerns for screening and prevention. These are reviewed and up-to-date. Referrals have been placed accordingly. Immunizations are up-to-date or declined.    Subjective:   Chief Complaint  Patient presents with  . Gynecologic Exam    Patient is here for pap smear.    HPI Barbara Mcclain 32 y.o. female presents to office today for PAP smear.      Review of Systems  Constitutional: Negative.  Negative for chills, fever, malaise/fatigue and weight loss.  Respiratory: Negative.  Negative for cough, shortness of breath and wheezing.   Cardiovascular: Negative.  Negative for chest pain, orthopnea and leg swelling.  Gastrointestinal: Negative for abdominal pain.  Genitourinary: Negative.  Negative for flank pain.  Skin: Negative.  Negative for rash.  Psychiatric/Behavioral: Negative for suicidal ideas.    Past Medical History:  Diagnosis Date  . Ovarian cyst     History reviewed. No pertinent surgical history.  Family History  Problem Relation Age of Onset  . CVA Father   . Diabetes Maternal Grandmother   . Hypertension Maternal Grandmother     Social History Reviewed with no changes to be made today.   Outpatient Medications Prior to Visit  Medication Sig Dispense Refill  . Carboxymethylcellul-Glycerin (CLEAR EYES FOR DRY EYES OP) Place 1 drop into both eyes daily as needed (eye discharge).    . ferrous sulfate 325 (65 FE) MG tablet Take 1 tablet (325 mg total) by mouth 3 (three) times daily with meals. 90 tablet 1   No facility-administered medications prior to visit.     No Known Allergies     Objective:    BP 119/81 (BP Location: Right Arm, Patient Position: Sitting, Cuff Size: Normal)   Pulse  84   Temp 99.3 F (37.4 C) (Oral)   Ht 5\' 7"  (1.702 m)   Wt 138 lb 9.6 oz (62.9 kg)   LMP 06/26/2018   SpO2 96%   BMI 21.71 kg/m  Wt Readings from Last 3 Encounters:  07/22/18 138 lb 9.6 oz (62.9 kg)  06/10/18 130 lb 12.8 oz (59.3 kg)  05/13/18 129 lb 12.8 oz (58.9 kg)    Physical Exam  Constitutional: She is oriented to person, place, and time. She appears well-developed and well-nourished.  HENT:  Head: Normocephalic.  Cardiovascular: Normal rate, regular rhythm and normal heart sounds.  Pulmonary/Chest: Effort normal and breath sounds normal.  Abdominal: Soft. Bowel sounds are normal. Hernia confirmed negative in the right inguinal area and confirmed negative in the left inguinal area.  Genitourinary: Rectum normal, vagina normal and uterus normal. Rectal exam shows no external hemorrhoid. No labial fusion. There is no rash, tenderness, lesion or injury on the right labia. There is no rash, tenderness, lesion or injury on the left labia. Uterus is not deviated and not enlarged. Cervix exhibits no motion tenderness and no friability. Right adnexum displays no mass, no tenderness and no fullness. Left adnexum displays no mass, no tenderness and no fullness. No erythema, tenderness or bleeding in the vagina. No foreign body in the vagina. No signs of injury around the vagina. No vaginal discharge found.  Lymphadenopathy: No inguinal adenopathy noted on the right or left side.       Right: No inguinal  adenopathy present.       Left: No inguinal adenopathy present.  Neurological: She is alert and oriented to person, place, and time.  Skin: Skin is warm and dry.  Psychiatric: She has a normal mood and affect. Her behavior is normal. Judgment and thought content normal.      Patient has been counseled extensively about nutrition and exercise as well as the importance of adherence with medications and regular follow-up. The patient was given clear instructions to go to ER or return to  medical center if symptoms don't improve, worsen or new problems develop. The patient verbalized understanding.   Follow-up: Return if symptoms worsen or fail to improve.   Gildardo Pounds, FNP-BC [facility] and [place], [place]   07/22/2018, 10:50 AM

## 2018-07-25 LAB — CYTOLOGY - PAP
Bacterial vaginitis: NEGATIVE
CHLAMYDIA, DNA PROBE: NEGATIVE
Candida vaginitis: POSITIVE — AB
DIAGNOSIS: NEGATIVE
Neisseria Gonorrhea: NEGATIVE
Trichomonas: NEGATIVE

## 2018-07-28 ENCOUNTER — Other Ambulatory Visit: Payer: Self-pay | Admitting: Nurse Practitioner

## 2018-07-28 MED ORDER — FLUCONAZOLE 150 MG PO TABS: 150.0000 mg | ORAL_TABLET | Freq: Once | ORAL | 1 refills | Status: AC

## 2018-07-28 MED FILL — FLUCONAZOLE 150 MG TABS: 150 | 1 days supply | Qty: 1 | Fill #0

## 2018-08-31 ENCOUNTER — Encounter: Payer: Medicaid Other | Admitting: Obstetrics & Gynecology

## 2018-09-30 ENCOUNTER — Ambulatory Visit (INDEPENDENT_AMBULATORY_CARE_PROVIDER_SITE_OTHER): Payer: Self-pay | Admitting: Obstetrics & Gynecology

## 2018-09-30 ENCOUNTER — Encounter: Payer: Self-pay | Admitting: Obstetrics & Gynecology

## 2018-09-30 VITALS — BP 125/84 | HR 104 | Ht 67.0 in | Wt 135.9 lb

## 2018-09-30 DIAGNOSIS — N920 Excessive and frequent menstruation with regular cycle: Secondary | ICD-10-CM

## 2018-09-30 DIAGNOSIS — D251 Intramural leiomyoma of uterus: Secondary | ICD-10-CM

## 2018-09-30 MED ORDER — NAPROXEN 500 MG PO TABS: 500.0000 mg | ORAL_TABLET | Freq: Two times a day (BID) | ORAL | 2 refills | Status: DC

## 2018-09-30 MED ORDER — TRANEXAMIC ACID 650 MG PO TABS: 1300.0000 mg | ORAL_TABLET | Freq: Three times a day (TID) | ORAL | 2 refills | Status: DC

## 2018-09-30 MED FILL — NAPROXEN 500 MG TABLET: 500 | 30 days supply | Qty: 60 | Fill #0

## 2018-09-30 MED FILL — TRANEXAMIC ACID 650 MG TAB: 650 | 5 days supply | Qty: 30 | Fill #0

## 2018-09-30 NOTE — Progress Notes (Signed)
GYNECOLOGY OFFICE VISIT NOTE  History:  32 y.o. G1P0010 here today for discussion of management of her heavy menstrual periods and large uterine fibroids. Accompanied by significant other. Reports normal menstrual periods until last few months, got heavier. Still regular cycles, periods last 3-5 days.  Heavy menstrual periods, wears multiple pads per day associated with cramping.  MRI showed 10 cm fibroid with submucosal component (see read below).  She has been on iron therapy, last hemoglobin a few months ago was 11. No symptoms of anemia.  Does report increased bladder pressure from the fibroid and pain.     She denies any abnormal vaginal discharge or other concerns.   Past Medical History:  Diagnosis Date  . Iron deficiency anemia due to chronic blood loss 05/02/2018  . Menorrhagia 05/02/2018  . Ovarian cyst   . Symptomatic anemia 05/02/2018  . Uterine fibroid 05/02/2018    History reviewed. No pertinent surgical history.  The following portions of the patient's history were reviewed and updated as appropriate: allergies, current medications, past family history, past medical history, past social history, past surgical history and problem list.   Health Maintenance:  Normal pap on 07/22/2018.    Review of Systems:  Pertinent items noted in HPI and remainder of comprehensive ROS otherwise negative.  Objective:  Physical Exam BP 125/84   Pulse (!) 104   Ht 5\' 7"  (1.702 m)   Wt 135 lb 14.4 oz (61.6 kg)   BMI 21.28 kg/m  CONSTITUTIONAL: Well-developed, well-nourished female in no acute distress.  HEENT:  Normocephalic, atraumatic. External right and left ear normal. No scleral icterus.  NECK: Normal range of motion, supple, no masses noted on observation SKIN: Skin is warm and dry. No rash noted. Not diaphoretic. No erythema. No pallor. MUSCULOSKELETAL: Normal range of motion. No edema noted. NEUROLOGIC: Alert and oriented to person, place, and time. Normal muscle tone  coordination. No cranial nerve deficit noted. PSYCHIATRIC: Normal mood and affect. Normal behavior. Normal judgment and thought content. CARDIOVASCULAR: Normal heart rate noted RESPIRATORY: Effort and breath sounds normal, no problems with respiration noted ABDOMEN: Soft, no distention noted.  20 week sized globular uterus palpated, nontender.  PELVIC: Deferred  Labs and Imaging 09/30/18 MRI PELVIS WITHOUT AND WITH CONTRAST CLINICAL DATA:  Evaluate for fibroids. Generalized fatigue and recent miscarriage CONTRAST:  68mL MULTIHANCE GADOBENATE DIMEGLUMINE 529 MG/ML IV SOLN COMPARISON:  05/01/2018. FINDINGS: Urinary Tract:  No abnormality visualized. Bowel:  Unremarkable visualized pelvic bowel loops. Vascular/Lymphatic: No pathologically enlarged lymph nodes. No significant vascular abnormality seen. Reproductive: Enlarged uterus measures 11.0 by 10.3 by 10.9 cm. There is a very large fibroid arising from the anterior and left uterine fundus which measures which has a volume of approximately 10.0 x 9.8 by 10.1 cm (volume = 520 cm^3). This is partially submucosal. The fibroid has mass effect upon the endometrium with rightward deviation of the endometrial cavity. Fairly uniform enhancement is identified following the administration of contrast material. There are multiple nabothian cysts identified which measure up to 1.2 cm. Normal physiologic appearance of both ovaries. The left ovary measures 1.7 x 3.0 by 2.5 cm. The right ovary measures 3.5 by 3.2 by 1.1 cm. Other:  No significant free fluid. Musculoskeletal: No suspicious bone lesions identified. IMPRESSION: 1. No acute findings. Enlarged uterus containing a single dominant fibroid with a volume of approximately 520 cc. This is partially submucosal and has marked mass effect upon the endometrial cavity. Electronically Signed   By: [name].D.  On: 05/02/2018 19:04  Assessment & Plan:  1. Menorrhagia with  regular cycle 2. Intramural leiomyoma of uterus Discussed association of large fibroid with submucosal component with menorrhagia, discussed management modalities. Offered medical management of the menorrhagia with Lysteda and Naproxen; and then surgical management with myomectomy as she desired to retain future fertility.   Risks of surgery were discussed with the patient including but not limited to: bleeding which may require transfusion or reoperation and hysterectomy (3-4% risk of intraoperative conversion to hysterectomy); infection which may require antibiotics; injury to bowel, bladder, ureters or other surrounding organs; need for additional procedures; formation of intraperitoneal adhesions that may lead to other complications or make future surgeries more difficult;  thromboembolic phenomenon, incisional problems and other postoperative/anesthesia complications.  Also discussed possible need for cesarean deliveries at 64 - [redacted] weeks gestation for subsequent pregnancies if the endometrial cavity is breached and possible recurrence of fibroids; 25% of women who undergo this procedure require further treatment. Will get medical therapy for now, then decide about surgery later. - tranexamic acid (LYSTEDA) 650 MG TABS tablet; Take 2 tablets (1,300 mg total) by mouth 3 (three) times daily. Take during menses for a maximum of five days  Dispense: 30 tablet; Refill: 2 - naproxen (NAPROSYN) 500 MG tablet; Take 1 tablet (500 mg total) by mouth 2 (two) times daily with a meal. For heavy periods  Dispense: 60 tablet; Refill: 2  Please refer to After Visit Summary for other counseling recommendations.   Return in about 3 weeks (around 10/21/2018) for Followup and possible discussion of surgery with Dr.Rodriques Badie.  Total face-to-face time with patient: 30 minutes.  Over 50% of encounter was spent on counseling and coordination of care.   [name], MD, [place] for [employer], [place]

## 2018-09-30 NOTE — Patient Instructions (Signed)
Myomectomy Myomectomy is surgery to remove a noncancerous tumor (myoma) from the uterus. Myomas are tumors made up of fibrous tissue. They are often called fibroid tumors. Fibroid tumors can range from the size of a pea to the size of a grapefruit. In a myomectomy, the fibroid tumor is removed without removing the uterus. Because these tumors are rarely cancerous, this surgery is usually done only if the tumor is growing or causing symptoms such as pain, pressure, bleeding, or pain with intercourse. LET YOUR HEALTH CARE PROVIDER KNOW ABOUT:  Any allergies you have.  All medicines you are taking, including vitamins, herbs, eye drops, creams, and over-the-counter medicines.  Previous problems you or members of your family have had with the use of anesthetics.  Any blood disorders you have.  Previous surgeries you have had.  Medical conditions you have. RISKS AND COMPLICATIONS Generally, this is a safe procedure. However, as with any procedure, complications can occur. Possible complications include:  Excessive bleeding.  Infection.  Injury to nearby organs.  Blood clots in the legs, chest, or brain.  Scar tissue on other organs and in the pelvis. This may require another surgery to remove the scar tissue.  BEFORE THE PROCEDURE  Ask your health care provider about changing or stopping your regular medicines. Avoid taking aspirin or blood thinners as directed by your health care provider.  Do not  eat or drink anything after midnight on the night before surgery.  If you smoke, do not  smoke for 2 weeks before the surgery.  Do not  drink alcohol the day before the surgery.  Arrange for someone to drive you home after the procedure or after your hospital stay. Also arrange for someone to help you with activities during your recovery. PROCEDURE You will be given medicine to make you sleep through the procedure (general anesthetic). Any of the following methods may be used to perform  a myomectomy:  Small monitors will be put on your body. They are used to check your heart, blood pressure, and oxygen level.  An IV access tube will be put into one of your veins. Medicine will be able to flow directly into your body through this IV tube.  You might be given a medicine to help you relax (sedative).  You will be given a medicine to make you sleep (general anesthetic). A breathing tube will be placed into your lungs during the procedure.  A thin, flexible tube (catheter) will be inserted into your bladder to collect urine.  Any of the following methods may be used to perform a myomectomy: ? Hysteroscopic myomectomy-This method may be used when the fibroid tumor is inside the cavity of the uterus. A long, thin tube that is like a telescope (hysteroscope) is inserted inside the uterus. A saline solution is put into your uterus. This expands the uterus and allows the surgeon to see the fibroids. Tools are passed through the hysteroscope to remove the fibroid tumor in pieces. ? Laparoscopic myomectomy-A few small cuts (incisions) are made in the lower abdomen. A thin, lighted tube with a tiny camera on the end (laparoscope) is inserted through one of the incisions. This gives the surgeon a good view of the area. The fibroid tumor is removed through the other incisions. The incisions are then closed with stitches (sutures) or staples. ? Abdominal myomectomy-This method is used when the fibroid tumor cannot be removed with a hysteroscope or laparoscope. The surgery is performed through a larger surgical incision in the abdomen. The   fibroid tumor is removed through this incision. The incision is closed with sutures or staples.  What to expect after the procedure  If you had a laparoscopic or hysteroscopic myomectomy, you may be able to go home the same day, or you may need to stay in the hospital overnight.  If you had an abdominal myomectomy, you may need to stay in the hospital for a  few days.  Your IV access tube and catheter will be removed in 1-2 days.  You may be given medicine for pain or to help you sleep.  You may be given an antibiotic medicine, if needed. This information is not intended to replace advice given to you by your health care provider. Make sure you discuss any questions you have with your health care provider. Document Released: 09/20/2007 Document Revised: 04/30/2016 Document Reviewed: 07/05/2013 Elsevier Interactive Patient Education  2017 [employer]    Uterine Fibroids Uterine fibroids are tissue masses (tumors) that can develop in the womb (uterus). They are also called leiomyomas. This type of tumor is not cancerous (benign) and does not spread to other parts of the body outside of the pelvic area, which is between the hip bones. Occasionally, fibroids may develop in the fallopian tubes, in the cervix, or on the support structures (ligaments) that surround the uterus. You can have one or many fibroids. Fibroids can vary in size, weight, and where they grow in the uterus. Some can become quite large. Most fibroids do not require medical treatment. What are the causes? A fibroid can develop when a single uterine cell keeps growing (replicating). Most cells in the human body have a control mechanism that keeps them from replicating without control. What are the signs or symptoms? Symptoms may include:  Heavy bleeding during your period.  Bleeding or spotting between periods.  Pelvic pain and pressure.  Bladder problems, such as needing to urinate more often (urinary frequency) or urgently.  Inability to reproduce offspring (infertility).  Miscarriages.  How is this diagnosed? Uterine fibroids are diagnosed through a physical exam. Your health care provider may feel the lumpy tumors during a pelvic exam. Ultrasonography and an MRI may be done to determine the size, location, and number of fibroids. How is this treated? Treatment may  include:  Watchful waiting. This involves getting the fibroid checked by your health care provider to see if it grows or shrinks. Follow your health care provider's recommendations for how often to have this checked.  Hormone medicines. These can be taken by mouth or given through an intrauterine device (IUD).  Surgery. ? Removing the fibroids (myomectomy) or the uterus (hysterectomy). ? Removing blood supply to the fibroids (uterine artery embolization).  If fibroids interfere with your fertility and you want to become pregnant, your health care provider may recommend having the fibroids removed. Follow these instructions at home:  Keep all follow-up visits as directed by your health care provider. This is important.  Take over-the-counter and prescription medicines only as told by your health care provider. ? If you were prescribed a hormone treatment, take the hormone medicines exactly as directed.  Ask your health care provider about taking iron pills and increasing the amount of dark green, leafy vegetables in your diet. These actions can help to boost your blood iron levels, which may be affected by heavy menstrual bleeding.  Pay close attention to your period and tell your health care provider about any changes, such as: ? Increased blood flow that requires you to use more  pads or tampons than usual per month. ? A change in the number of days that your period lasts per month. ? A change in symptoms that are associated with your period, such as abdominal cramping or back pain. Contact a health care provider if:  You have pelvic pain, back pain, or abdominal cramps that cannot be controlled with medicines.  You have an increase in bleeding between and during periods.  You soak tampons or pads in a half hour or less.  You feel lightheaded, extra tired, or weak. Get help right away if:  You faint.  You have a sudden increase in pelvic pain. This information is not intended to  replace advice given to you by your health care provider. Make sure you discuss any questions you have with your health care provider. Document Released: 11/20/2000 Document Revised: 07/23/2016 Document Reviewed: 05/22/2014 Elsevier Interactive Patient Education  [employer].

## 2018-10-21 ENCOUNTER — Encounter (HOSPITAL_COMMUNITY): Payer: Self-pay

## 2018-10-21 ENCOUNTER — Ambulatory Visit (INDEPENDENT_AMBULATORY_CARE_PROVIDER_SITE_OTHER): Payer: Self-pay | Admitting: Obstetrics & Gynecology

## 2018-10-21 ENCOUNTER — Encounter: Payer: Self-pay | Admitting: Obstetrics & Gynecology

## 2018-10-21 VITALS — BP 116/82 | HR 94 | Ht 67.0 in | Wt 135.8 lb

## 2018-10-21 DIAGNOSIS — D250 Submucous leiomyoma of uterus: Secondary | ICD-10-CM

## 2018-10-21 DIAGNOSIS — Z01818 Encounter for other preprocedural examination: Secondary | ICD-10-CM

## 2018-10-21 DIAGNOSIS — N920 Excessive and frequent menstruation with regular cycle: Secondary | ICD-10-CM

## 2018-10-21 DIAGNOSIS — D251 Intramural leiomyoma of uterus: Secondary | ICD-10-CM

## 2018-10-21 DIAGNOSIS — D500 Iron deficiency anemia secondary to blood loss (chronic): Secondary | ICD-10-CM

## 2018-10-21 MED ORDER — TRANEXAMIC ACID 650 MG PO TABS: 1300.0000 mg | ORAL_TABLET | Freq: Three times a day (TID) | ORAL | 5 refills | Status: DC

## 2018-10-21 MED ORDER — NAPROXEN 500 MG PO TABS: 500.0000 mg | ORAL_TABLET | Freq: Two times a day (BID) | ORAL | 5 refills | Status: DC

## 2018-10-21 NOTE — Patient Instructions (Signed)
Myomectomy Myomectomy is surgery to remove a noncancerous tumor (myoma) from the uterus. Myomas are tumors made up of fibrous tissue. They are often called fibroid tumors. Fibroid tumors can range from the size of a pea to the size of a grapefruit. In a myomectomy, the fibroid tumor is removed without removing the uterus. Because these tumors are rarely cancerous, this surgery is usually done only if the tumor is growing or causing symptoms such as pain, pressure, bleeding, or pain with intercourse. LET [facility] CARE PROVIDER KNOW ABOUT:  Any allergies you have.  All medicines you are taking, including vitamins, herbs, eye drops, creams, and over-the-counter medicines.  Previous problems you or members of your family have had with the use of anesthetics.  Any blood disorders you have.  Previous surgeries you have had.  Medical conditions you have. RISKS AND COMPLICATIONS Generally, this is a safe procedure. However, as with any procedure, complications can occur. Possible complications include:  Excessive bleeding.  Infection.  Injury to nearby organs.  Blood clots in the legs, chest, or brain.  Scar tissue on other organs and in the pelvis. This may require another surgery to remove the scar tissue.  BEFORE THE PROCEDURE  Ask your health care provider about changing or stopping your regular medicines. Avoid taking aspirin or blood thinners as directed by your health care provider.  Do not  eat or drink anything after midnight on the night before surgery.  If you smoke, do not  smoke for 2 weeks before the surgery.  Do not  drink alcohol the day before the surgery.  Arrange for someone to drive you home after the procedure or after your hospital stay. Also arrange for someone to help you with activities during your recovery. PROCEDURE You will be given medicine to make you sleep through the procedure (general anesthetic). Any of the following methods may be used to perform  a myomectomy:  Small monitors will be put on your body. They are used to check your heart, blood pressure, and oxygen level.  An IV access tube will be put into one of your veins. Medicine will be able to flow directly into your body through this IV tube.  You might be given a medicine to help you relax (sedative).  You will be given a medicine to make you sleep (general anesthetic). A breathing tube will be placed into your lungs during the procedure.  A thin, flexible tube (catheter) will be inserted into your bladder to collect urine.  Any of the following methods may be used to perform a myomectomy: ? Hysteroscopic myomectomy-This method may be used when the fibroid tumor is inside the cavity of the uterus. A long, thin tube that is like a telescope (hysteroscope) is inserted inside the uterus. A saline solution is put into your uterus. This expands the uterus and allows the surgeon to see the fibroids. Tools are passed through the hysteroscope to remove the fibroid tumor in pieces. ? Laparoscopic myomectomy-A few small cuts (incisions) are made in the lower abdomen. A thin, lighted tube with a tiny camera on the end (laparoscope) is inserted through one of the incisions. This gives the surgeon a good view of the area. The fibroid tumor is removed through the other incisions. The incisions are then closed with stitches (sutures) or staples. ? Abdominal myomectomy-This method is used when the fibroid tumor cannot be removed with a hysteroscope or laparoscope. The surgery is performed through a larger surgical incision in the abdomen. The  fibroid tumor is removed through this incision. The incision is closed with sutures or staples.  What to expect after the procedure  If you had a laparoscopic or hysteroscopic myomectomy, you may be able to go home the same day, or you may need to stay in the hospital overnight.  If you had an abdominal myomectomy, you may need to stay in the hospital for a  few days.  Your IV access tube and catheter will be removed in 1-2 days.  You may be given medicine for pain or to help you sleep.  You may be given an antibiotic medicine, if needed. This information is not intended to replace advice given to you by your health care provider. Make sure you discuss any questions you have with your health care provider. Document Released: 09/20/2007 Document Revised: 04/30/2016 Document Reviewed: 07/05/2013 Elsevier Interactive Patient Education  2017 [employer].

## 2018-10-21 NOTE — H&P (View-Only) (Signed)
GYNECOLOGY OFFICE VISIT NOTE  History:  32 y.o. G2P0020 here today for discussion of surgical intervention for her uterine fibroid. Last visit, was prescribed Naproxen and Lysteda, reports decreased pain and bleeding but she wants surgical removal of the large fibroid.  Still reports periodic pain.  She denies any other concerns.   Past Medical History:  Diagnosis Date  . Iron deficiency anemia due to chronic blood loss 05/02/2018  . Menorrhagia 05/02/2018  . Ovarian cyst   . Symptomatic anemia 05/02/2018  . Uterine fibroid 05/02/2018    History reviewed. No pertinent surgical history.  The following portions of the patient's history were reviewed and updated as appropriate: allergies, current medications, past family history, past medical history, past social history, past surgical history and problem list.   Health Maintenance:  Normal pap on 07/22/2018.   Review of Systems:  Pertinent items noted in HPI and remainder of comprehensive ROS otherwise negative.  Objective:  Physical Exam BP 116/82   Pulse 94   Ht 5\' 7"  (1.702 m)   Wt 135 lb 12.8 oz (61.6 kg)   LMP 10/04/2018   BMI 21.27 kg/m  CONSTITUTIONAL: Well-developed, well-nourished female in no acute distress.  HEENT:  Normocephalic, atraumatic. External right and left ear normal. No scleral icterus.  NECK: Normal range of motion, supple, no masses noted on observation SKIN: Skin is warm and dry. No rash noted. Not diaphoretic. No erythema. No pallor. MUSCULOSKELETAL: Normal range of motion. No edema noted. NEUROLOGIC: Alert and oriented to person, place, and time. Normal muscle tone coordination. No cranial nerve deficit noted. PSYCHIATRIC: Normal mood and affect. Normal behavior. Normal judgment and thought content. CARDIOVASCULAR: Normal heart rate noted RESPIRATORY: Effort and breath sounds normal, no problems with respiration noted ABDOMEN: Soft, no distention noted.   PELVIC: Deferred  Labs and Imaging 09/30/18  MRI PELVIS WITHOUT AND WITH CONTRAST CLINICAL DATA: Evaluate for fibroids. Generalized fatigue and recent miscarriage CONTRAST: 73mL MULTIHANCE GADOBENATE DIMEGLUMINE 529 MG/ML IV SOLN COMPARISON: 05/01/2018. FINDINGS: Urinary Tract: No abnormality visualized. Bowel: Unremarkable visualized pelvic bowel loops. Vascular/Lymphatic: No pathologically enlarged lymph nodes. No significant vascular abnormality seen. Reproductive: Enlarged uterus measures 11.0 by 10.3 by 10.9 cm. There is a very large fibroid arising from the anterior and left uterine fundus which measures which has a volume of approximately 10.0 x 9.8 by 10.1 cm (volume = 520 cm^3). This is partially submucosal. The fibroid has mass effect upon the endometrium with rightward deviation of the endometrial cavity. Fairly uniform enhancement is identified following the administration of contrast material. There are multiple nabothian cysts identified which measure up to 1.2 cm. Normal physiologic appearance of both ovaries. The left ovary measures 1.7 x 3.0 by 2.5 cm. The right ovary measures 3.5 by 3.2 by 1.1 cm. Other: No significant free fluid. Musculoskeletal: No suspicious bone lesions identified. IMPRESSION: 1. No acute findings. Enlarged uterus containing a single dominant fibroid with a volume of approximately 520 cc. This is partially submucosal and has marked mass effect upon the endometrial cavity. Electronically Signed By: Kerby Moors M.D. On: 05/02/2018 19:04  Assessment & Plan:  1. Intramural and submucous leiomyoma of uterus 2. Menorrhagia with regular cycle 3. Iron deficiency anemia due to chronic blood loss 4. Preoperative exam for gynecologic surgery Patient desires abdominal myomectomy. Risks of surgery were discussed with the patient including but not limited to: bleeding which may require transfusion or reoperation and hysterectomy (3-4% risk of intraoperative conversion to  hysterectomy); infection which may require antibiotics; injury to  bowel, bladder, ureters or other surrounding organs; need for additional procedures; formation of intraperitoneal adhesions that may lead to other complications or make future surgeries more difficult;  thromboembolic phenomenon, incisional problems and other postoperative/anesthesia complications.  Also discussed possible need for cesarean deliveries at 26 - [redacted] weeks gestation for subsequent pregnancies if the endometrial cavity is breached and possible recurrence of fibroids; up to 25% of women who undergo this procedure require further treatment.  Patient was told that the likelihood that her condition and symptoms will be treated effectively with this surgical management was very high; the postoperative expectations were also discussed in detail. The patient also understands the alternative treatment options which were discussed in full. All questions were answered.  She was told that she will be contacted by our surgical scheduler regarding the time and date of her surgery; routine preoperative instructions of having nothing to eat or drink after midnight on the day prior to surgery and also coming to the hospital 1.5 hours prior to her time of surgery were also emphasized.  She was told she may be called for a preoperative appointment about a week prior to surgery and will be given further preoperative instructions at that visit. Printed patient education handouts about the procedure were given to the patient to review at home. Will continue Lysteda and Naproxen for now.  - tranexamic acid (LYSTEDA) 650 MG TABS tablet; Take 2 tablets (1,300 mg total) by mouth 3 (three) times daily. Take during menses for a maximum of five days  Dispense: 30 tablet; Refill: 5 - naproxen (NAPROSYN) 500 MG tablet; Take 1 tablet (500 mg total) by mouth 2 (two) times daily with a meal. For heavy periods  Dispense: 60 tablet; Refill: 5  Please refer to After Visit  Summary for other counseling recommendations.   Return if symptoms worsen or fail to improve.   Total face-to-face time with patient: 25 minutes.  Over 50% of encounter was spent on counseling and coordination of care.   [name], MD, [place] for [facility], [place]

## 2018-10-21 NOTE — Progress Notes (Signed)
GYNECOLOGY OFFICE VISIT NOTE  History:  32 y.o. G2P0020 here today for discussion of surgical intervention for her uterine fibroid. Last visit, was prescribed Naproxen and Lysteda, reports decreased pain and bleeding but she wants surgical removal of the large fibroid.  Still reports periodic pain.  She denies any other concerns.   Past Medical History:  Diagnosis Date  . Iron deficiency anemia due to chronic blood loss 05/02/2018  . Menorrhagia 05/02/2018  . Ovarian cyst   . Symptomatic anemia 05/02/2018  . Uterine fibroid 05/02/2018    History reviewed. No pertinent surgical history.  The following portions of the patient's history were reviewed and updated as appropriate: allergies, current medications, past family history, past medical history, past social history, past surgical history and problem list.   Health Maintenance:  Normal pap on 07/22/2018.   Review of Systems:  Pertinent items noted in HPI and remainder of comprehensive ROS otherwise negative.  Objective:  Physical Exam BP 116/82   Pulse 94   Ht 5\' 7"  (1.702 m)   Wt 135 lb 12.8 oz (61.6 kg)   LMP 10/04/2018   BMI 21.27 kg/m  CONSTITUTIONAL: Well-developed, well-nourished female in no acute distress.  HEENT:  Normocephalic, atraumatic. External right and left ear normal. No scleral icterus.  NECK: Normal range of motion, supple, no masses noted on observation SKIN: Skin is warm and dry. No rash noted. Not diaphoretic. No erythema. No pallor. MUSCULOSKELETAL: Normal range of motion. No edema noted. NEUROLOGIC: Alert and oriented to person, place, and time. Normal muscle tone coordination. No cranial nerve deficit noted. PSYCHIATRIC: Normal mood and affect. Normal behavior. Normal judgment and thought content. CARDIOVASCULAR: Normal heart rate noted RESPIRATORY: Effort and breath sounds normal, no problems with respiration noted ABDOMEN: Soft, no distention noted.   PELVIC: Deferred  Labs and Imaging 09/30/18  MRI PELVIS WITHOUT AND WITH CONTRAST CLINICAL DATA: Evaluate for fibroids. Generalized fatigue and recent miscarriage CONTRAST: 39mL MULTIHANCE GADOBENATE DIMEGLUMINE 529 MG/ML IV SOLN COMPARISON: 05/01/2018. FINDINGS: Urinary Tract: No abnormality visualized. Bowel: Unremarkable visualized pelvic bowel loops. Vascular/Lymphatic: No pathologically enlarged lymph nodes. No significant vascular abnormality seen. Reproductive: Enlarged uterus measures 11.0 by 10.3 by 10.9 cm. There is a very large fibroid arising from the anterior and left uterine fundus which measures which has a volume of approximately 10.0 x 9.8 by 10.1 cm (volume = 520 cm^3). This is partially submucosal. The fibroid has mass effect upon the endometrium with rightward deviation of the endometrial cavity. Fairly uniform enhancement is identified following the administration of contrast material. There are multiple nabothian cysts identified which measure up to 1.2 cm. Normal physiologic appearance of both ovaries. The left ovary measures 1.7 x 3.0 by 2.5 cm. The right ovary measures 3.5 by 3.2 by 1.1 cm. Other: No significant free fluid. Musculoskeletal: No suspicious bone lesions identified. IMPRESSION: 1. No acute findings. Enlarged uterus containing a single dominant fibroid with a volume of approximately 520 cc. This is partially submucosal and has marked mass effect upon the endometrial cavity. Electronically Signed By: Kerby Moors M.D. On: 05/02/2018 19:04  Assessment & Plan:  1. Intramural and submucous leiomyoma of uterus 2. Menorrhagia with regular cycle 3. Iron deficiency anemia due to chronic blood loss 4. Preoperative exam for gynecologic surgery Patient desires abdominal myomectomy. Risks of surgery were discussed with the patient including but not limited to: bleeding which may require transfusion or reoperation and hysterectomy (3-4% risk of intraoperative conversion to  hysterectomy); infection which may require antibiotics; injury to  bowel, bladder, ureters or other surrounding organs; need for additional procedures; formation of intraperitoneal adhesions that may lead to other complications or make future surgeries more difficult;  thromboembolic phenomenon, incisional problems and other postoperative/anesthesia complications.  Also discussed possible need for cesarean deliveries at 48 - [redacted] weeks gestation for subsequent pregnancies if the endometrial cavity is breached and possible recurrence of fibroids; up to 25% of women who undergo this procedure require further treatment.  Patient was told that the likelihood that her condition and symptoms will be treated effectively with this surgical management was very high; the postoperative expectations were also discussed in detail. The patient also understands the alternative treatment options which were discussed in full. All questions were answered.  She was told that she will be contacted by our surgical scheduler regarding the time and date of her surgery; routine preoperative instructions of having nothing to eat or drink after midnight on the day prior to surgery and also coming to the hospital 1.5 hours prior to her time of surgery were also emphasized.  She was told she may be called for a preoperative appointment about a week prior to surgery and will be given further preoperative instructions at that visit. Printed patient education handouts about the procedure were given to the patient to review at home. Will continue Lysteda and Naproxen for now.  - tranexamic acid (LYSTEDA) 650 MG TABS tablet; Take 2 tablets (1,300 mg total) by mouth 3 (three) times daily. Take during menses for a maximum of five days  Dispense: 30 tablet; Refill: 5 - naproxen (NAPROSYN) 500 MG tablet; Take 1 tablet (500 mg total) by mouth 2 (two) times daily with a meal. For heavy periods  Dispense: 60 tablet; Refill: 5  Please refer to After Visit  Summary for other counseling recommendations.   Return if symptoms worsen or fail to improve.   Total face-to-face time with patient: 25 minutes.  Over 50% of encounter was spent on counseling and coordination of care.   [name], MD, [place] for [facility], [place]

## 2018-10-24 ENCOUNTER — Telehealth: Payer: Self-pay | Admitting: *Deleted

## 2018-10-24 NOTE — Telephone Encounter (Addendum)
Message left by pharmacist stating that Rx for Lysteda is very expensive for the pt.  She requests alternate Rx for Megestrol from Dr. Harolyn Rutherford of possible. I will send message to Dr. Harolyn Rutherford with this information.   11/19  1333  Per chart review, Rx for Megace (Megestrol) has been sent to pt's pharmacy by Dr. Harolyn Rutherford.

## 2018-10-25 ENCOUNTER — Other Ambulatory Visit: Payer: Self-pay

## 2018-10-25 DIAGNOSIS — N920 Excessive and frequent menstruation with regular cycle: Secondary | ICD-10-CM

## 2018-10-25 MED ORDER — MEGESTROL ACETATE 40 MG PO TABS: 40.0000 mg | ORAL_TABLET | Freq: Every day | ORAL | 2 refills | Status: DC

## 2018-10-25 NOTE — Progress Notes (Signed)
meg

## 2018-11-02 NOTE — Patient Instructions (Addendum)
Your procedure is scheduled on:  Tuesday, 12/10  Enter through the Main Entrance of [facility] at: 6:45 am  Pick up the phone at the desk and dial [phone].  Call this number if you have problems the morning of surgery: [phone].  Remember: Do NOT eat food or Do NOT drink clear liquids (including water) after midnight Monday  Take these medicines the morning of surgery with a SIP OF WATER: None  Brush your teeth on the day of surgery.  Do Not smoke on the day of surgery.  Stop herbal medications, vitamin supplements, Ibuprofen/NSAIDS at this time.  Do NOT wear jewelry (body piercing), metal hair clips/bobby pins, make-up, or nail polish. Do NOT wear lotions, powders, or perfumes.  You may wear deoderant. Do NOT shave for 48 hours prior to surgery. Do NOT bring valuables to the hospital.  Leave suitcase in car.  After surgery it may be brought to your room.  For patients admitted to the hospital, checkout time is 11:00 AM the day of discharge. Have a responsible adult drive you home and stay with you for 24 hours after your procedure.  Home with [place] cell [phone].

## 2018-11-07 ENCOUNTER — Encounter (HOSPITAL_COMMUNITY): Payer: Self-pay

## 2018-11-07 ENCOUNTER — Encounter (HOSPITAL_COMMUNITY)
Admission: RE | Admit: 2018-11-07 | Discharge: 2018-11-07 | Disposition: A | Discharge status: Home / Self Care | Payer: Medicaid Other | Source: Ambulatory Visit | Attending: Obstetrics & Gynecology | Admitting: Obstetrics & Gynecology

## 2018-11-07 ENCOUNTER — Other Ambulatory Visit: Payer: Self-pay

## 2018-11-07 DIAGNOSIS — Z01812 Encounter for preprocedural laboratory examination: Secondary | ICD-10-CM | POA: Insufficient documentation

## 2018-11-07 HISTORY — DX: Personal history of other medical treatment: Z92.89

## 2018-11-07 LAB — CBC
HEMATOCRIT: 38.3 % (ref 36.0–46.0)
Hemoglobin: 12.6 g/dL (ref 12.0–15.0)
MCH: 32.9 pg (ref 26.0–34.0)
MCHC: 32.9 g/dL (ref 30.0–36.0)
MCV: 100 fL (ref 80.0–100.0)
NRBC: 0 % (ref 0.0–0.2)
Platelets: 343 10*3/uL (ref 150–400)
RBC: 3.83 MIL/uL — ABNORMAL LOW (ref 3.87–5.11)
RDW: 13.3 % (ref 11.5–15.5)
WBC: 5.9 10*3/uL (ref 4.0–10.5)

## 2018-11-07 NOTE — Pre-Procedure Instructions (Signed)
Kirsten from Lab called to let me know about positive antibody screen.  Lab is requesting patient to come in early for a repeat T & S blood draw.  I called patient and LMOM for patient to arrive at 6 am instead of 645 am on DOS for blood draw.  T & S has been ordered for DOS in Epic.

## 2018-11-07 NOTE — Pre-Procedure Instructions (Signed)
SDS BB History Log given to Lab for patient's previous history of a blood transfusion at [facility] in 04/2018.

## 2018-11-08 MED FILL — MEGESTROL 40 MG TABLET: 40 | 30 days supply | Qty: 60 | Fill #0

## 2018-11-14 NOTE — Anesthesia Preprocedure Evaluation (Addendum)
Anesthesia Evaluation  Patient identified by MRN, date of birth, ID band Patient awake    Reviewed: Allergy & Precautions, H&P , NPO status , Patient's Chart, lab work & pertinent test results, reviewed documented beta blocker date and time   Airway Mallampati: I  TM Distance: >3 FB Neck ROM: full    Dental no notable dental hx.    Pulmonary neg pulmonary ROS, former smoker,    Pulmonary exam normal breath sounds clear to auscultation       Cardiovascular Exercise Tolerance: Good negative cardio ROS   Rhythm:regular Rate:Normal     Neuro/Psych negative neurological ROS  negative psych ROS   GI/Hepatic negative GI ROS, Neg liver ROS,   Endo/Other  negative endocrine ROS  Renal/GU negative Renal ROS  negative genitourinary   Musculoskeletal   Abdominal   Peds  Hematology  (+) Blood dyscrasia, anemia ,   Anesthesia Other Findings   Reproductive/Obstetrics negative OB ROS                            Anesthesia Physical Anesthesia Plan  ASA: II  Anesthesia Plan: General   Post-op Pain Management:    Induction: Intravenous  PONV Risk Score and Plan: 3 and Ondansetron, Dexamethasone, Treatment may vary due to age or medical condition and Midazolam  Airway Management Planned: Oral ETT and LMA  Additional Equipment:   Intra-op Plan:   Post-operative Plan: Extubation in OR  Informed Consent: I have reviewed the patients History and Physical, chart, labs and discussed the procedure including the risks, benefits and alternatives for the proposed anesthesia with the patient or authorized representative who has indicated his/her understanding and acceptance.   Dental Advisory Given  Plan Discussed with: CRNA, Anesthesiologist and Surgeon  Anesthesia Plan Comments: (  )        Anesthesia Quick Evaluation

## 2018-11-15 ENCOUNTER — Inpatient Hospital Stay (HOSPITAL_COMMUNITY): Payer: Self-pay | Admitting: Anesthesiology

## 2018-11-15 ENCOUNTER — Inpatient Hospital Stay (HOSPITAL_COMMUNITY)
Admission: RE | Admit: 2018-11-15 | Discharge: 2018-11-17 | DRG: 743 | Disposition: A | Discharge status: Home / Self Care | Payer: Self-pay | Attending: Obstetrics & Gynecology | Admitting: Obstetrics & Gynecology

## 2018-11-15 ENCOUNTER — Other Ambulatory Visit: Payer: Self-pay

## 2018-11-15 ENCOUNTER — Encounter (HOSPITAL_COMMUNITY): Payer: Self-pay

## 2018-11-15 ENCOUNTER — Encounter (HOSPITAL_COMMUNITY)
Admission: RE | Disposition: A | Discharge status: Home / Self Care | Payer: Self-pay | Source: Home / Self Care | Attending: Obstetrics & Gynecology

## 2018-11-15 DIAGNOSIS — Z87891 Personal history of nicotine dependence: Secondary | ICD-10-CM

## 2018-11-15 DIAGNOSIS — Z23 Encounter for immunization: Secondary | ICD-10-CM

## 2018-11-15 DIAGNOSIS — D259 Leiomyoma of uterus, unspecified: Secondary | ICD-10-CM | POA: Diagnosis present

## 2018-11-15 DIAGNOSIS — N920 Excessive and frequent menstruation with regular cycle: Secondary | ICD-10-CM | POA: Diagnosis present

## 2018-11-15 DIAGNOSIS — D649 Anemia, unspecified: Secondary | ICD-10-CM | POA: Diagnosis present

## 2018-11-15 DIAGNOSIS — D251 Intramural leiomyoma of uterus: Secondary | ICD-10-CM | POA: Diagnosis present

## 2018-11-15 DIAGNOSIS — D252 Subserosal leiomyoma of uterus: Secondary | ICD-10-CM

## 2018-11-15 DIAGNOSIS — D62 Acute posthemorrhagic anemia: Secondary | ICD-10-CM | POA: Diagnosis present

## 2018-11-15 DIAGNOSIS — Z9889 Other specified postprocedural states: Secondary | ICD-10-CM

## 2018-11-15 DIAGNOSIS — D500 Iron deficiency anemia secondary to blood loss (chronic): Secondary | ICD-10-CM | POA: Diagnosis present

## 2018-11-15 DIAGNOSIS — D250 Submucous leiomyoma of uterus: Principal | ICD-10-CM | POA: Diagnosis present

## 2018-11-15 HISTORY — PX: MYOMECTOMY: SHX85

## 2018-11-15 LAB — TYPE AND SCREEN
ABO/RH(D): A POS
Antibody Screen: POSITIVE
Donor AG Type: NEGATIVE
Donor AG Type: NEGATIVE
Donor AG Type: NEGATIVE
Donor AG Type: NEGATIVE
PT AG Type: NEGATIVE
UNIT DIVISION: 0
UNIT DIVISION: 0
UNIT DIVISION: 0
Unit division: 0

## 2018-11-15 LAB — CBC
HCT: 33 % — ABNORMAL LOW (ref 36.0–46.0)
Hemoglobin: 10.9 g/dL — ABNORMAL LOW (ref 12.0–15.0)
MCH: 33 pg (ref 26.0–34.0)
MCHC: 33 g/dL (ref 30.0–36.0)
MCV: 100 fL (ref 80.0–100.0)
Platelets: 362 10*3/uL (ref 150–400)
RBC: 3.3 MIL/uL — ABNORMAL LOW (ref 3.87–5.11)
RDW: 13.4 % (ref 11.5–15.5)
WBC: 41.6 10*3/uL — ABNORMAL HIGH (ref 4.0–10.5)
nRBC: 0 % (ref 0.0–0.2)

## 2018-11-15 LAB — BPAM RBC
BLOOD PRODUCT EXPIRATION DATE: 201912282359
BLOOD PRODUCT EXPIRATION DATE: 201912292359
BLOOD PRODUCT EXPIRATION DATE: 201912292359
Blood Product Expiration Date: 201912282359
UNIT TYPE AND RH: 9500
Unit Type and Rh: 5100
Unit Type and Rh: 5100
Unit Type and Rh: 9500

## 2018-11-15 LAB — PREGNANCY, URINE: Preg Test, Ur: NEGATIVE

## 2018-11-15 SURGERY — MYOMECTOMY, ABDOMINAL APPROACH
Anesthesia: General | Site: Abdomen

## 2018-11-15 MED ORDER — BUPIVACAINE HCL (PF) 0.5 % IJ SOLN
INTRAMUSCULAR | Status: AC
  Filled 2018-11-15: qty 30

## 2018-11-15 MED ORDER — ZOLPIDEM TARTRATE 5 MG PO TABS: 5.0000 mg | ORAL_TABLET | Freq: Every evening | ORAL | Status: DC | PRN

## 2018-11-15 MED ORDER — INFLUENZA VAC SPLIT QUAD 0.5 ML IM SUSY
0.5000 mL | PREFILLED_SYRINGE | INTRAMUSCULAR | Status: AC
  Administered 2018-11-17: 0.5 mL via INTRAMUSCULAR

## 2018-11-15 MED ORDER — ONDANSETRON HCL 4 MG/2ML IJ SOLN
4.0000 mg | Freq: Four times a day (QID) | INTRAMUSCULAR | Status: DC | PRN
  Filled 2018-11-15: qty 2

## 2018-11-15 MED ORDER — SUGAMMADEX SODIUM 200 MG/2ML IV SOLN
INTRAVENOUS | Status: AC
  Filled 2018-11-15: qty 2

## 2018-11-15 MED ORDER — DEXAMETHASONE SODIUM PHOSPHATE 10 MG/ML IJ SOLN
INTRAMUSCULAR | Status: DC | PRN
  Administered 2018-11-15: 10 mg via INTRAVENOUS

## 2018-11-15 MED ORDER — IBUPROFEN 600 MG PO TABS
600.0000 mg | ORAL_TABLET | Freq: Four times a day (QID) | ORAL | Status: DC | PRN
  Administered 2018-11-16 – 2018-11-17 (×2): 600 mg via ORAL
  Filled 2018-11-15 (×2): qty 1

## 2018-11-15 MED ORDER — SCOPOLAMINE 1 MG/3DAYS TD PT72
MEDICATED_PATCH | TRANSDERMAL | Status: AC
  Administered 2018-11-15: 1.5 mg via TRANSDERMAL
  Filled 2018-11-15: qty 1

## 2018-11-15 MED ORDER — OXYCODONE HCL 5 MG PO TABS: 5.0000 mg | ORAL_TABLET | Freq: Once | ORAL | Status: DC | PRN

## 2018-11-15 MED ORDER — PROPOFOL 10 MG/ML IV BOLUS
INTRAVENOUS | Status: AC
  Filled 2018-11-15: qty 20

## 2018-11-15 MED ORDER — FENTANYL CITRATE (PF) 100 MCG/2ML IJ SOLN
INTRAMUSCULAR | Status: AC
  Administered 2018-11-15: 50 ug via INTRAVENOUS
  Filled 2018-11-15: qty 2

## 2018-11-15 MED ORDER — OXYCODONE-ACETAMINOPHEN 5-325 MG PO TABS
1.0000 | ORAL_TABLET | ORAL | Status: DC | PRN
  Administered 2018-11-16 – 2018-11-17 (×2): 1 via ORAL
  Filled 2018-11-15: qty 2
  Filled 2018-11-15: qty 1

## 2018-11-15 MED ORDER — MENTHOL 3 MG MT LOZG: 1.0000 | LOZENGE | OROMUCOSAL | Status: DC | PRN

## 2018-11-15 MED ORDER — LACTATED RINGERS IV SOLN
INTRAVENOUS | Status: DC
  Administered 2018-11-15: 11:00:00 via INTRAVENOUS

## 2018-11-15 MED ORDER — BUPIVACAINE HCL (PF) 0.5 % IJ SOLN
INTRAMUSCULAR | Status: DC | PRN
  Administered 2018-11-15: 30 mL

## 2018-11-15 MED ORDER — HYDROMORPHONE HCL 1 MG/ML IJ SOLN: 1.0000 mg | INTRAMUSCULAR | Status: DC | PRN

## 2018-11-15 MED ORDER — DOCUSATE SODIUM 100 MG PO CAPS
100.0000 mg | ORAL_CAPSULE | Freq: Two times a day (BID) | ORAL | Status: DC
  Administered 2018-11-15 – 2018-11-17 (×4): 100 mg via ORAL
  Filled 2018-11-15 (×4): qty 1

## 2018-11-15 MED ORDER — SIMETHICONE 80 MG PO CHEW: 80.0000 mg | CHEWABLE_TABLET | Freq: Four times a day (QID) | ORAL | Status: DC | PRN

## 2018-11-15 MED ORDER — SENNOSIDES-DOCUSATE SODIUM 8.6-50 MG PO TABS: 1.0000 | ORAL_TABLET | Freq: Every evening | ORAL | Status: DC | PRN

## 2018-11-15 MED ORDER — FENTANYL CITRATE (PF) 250 MCG/5ML IJ SOLN
INTRAMUSCULAR | Status: AC
  Filled 2018-11-15: qty 5

## 2018-11-15 MED ORDER — BISACODYL 10 MG RE SUPP
10.0000 mg | Freq: Every day | RECTAL | Status: DC | PRN
  Filled 2018-11-15: qty 1

## 2018-11-15 MED ORDER — ROCURONIUM BROMIDE 100 MG/10ML IV SOLN
INTRAVENOUS | Status: AC
  Filled 2018-11-15: qty 1

## 2018-11-15 MED ORDER — ONDANSETRON HCL 4 MG/2ML IJ SOLN: 4.0000 mg | Freq: Once | INTRAMUSCULAR | Status: DC | PRN

## 2018-11-15 MED ORDER — SUGAMMADEX SODIUM 200 MG/2ML IV SOLN
INTRAVENOUS | Status: DC | PRN
  Administered 2018-11-15: 200 mg via INTRAVENOUS

## 2018-11-15 MED ORDER — DIPHENHYDRAMINE HCL 12.5 MG/5ML PO ELIX: 12.5000 mg | ORAL_SOLUTION | Freq: Four times a day (QID) | ORAL | Status: DC | PRN

## 2018-11-15 MED ORDER — PROPOFOL 10 MG/ML IV BOLUS
INTRAVENOUS | Status: DC | PRN
  Administered 2018-11-15: 180 mg via INTRAVENOUS

## 2018-11-15 MED ORDER — CEFAZOLIN SODIUM-DEXTROSE 2-4 GM/100ML-% IV SOLN
2.0000 g | INTRAVENOUS | Status: AC
  Administered 2018-11-15: 2 g via INTRAVENOUS

## 2018-11-15 MED ORDER — SODIUM CHLORIDE 0.9% FLUSH: 9.0000 mL | INTRAVENOUS | Status: DC | PRN

## 2018-11-15 MED ORDER — VASOPRESSIN 20 UNIT/ML IV SOLN
INTRAVENOUS | Status: DC | PRN
  Administered 2018-11-15: 20 mL via INTRAMUSCULAR

## 2018-11-15 MED ORDER — OXYCODONE HCL 5 MG/5ML PO SOLN: 5.0000 mg | Freq: Once | ORAL | Status: DC | PRN

## 2018-11-15 MED ORDER — FENTANYL CITRATE (PF) 100 MCG/2ML IJ SOLN
25.0000 ug | INTRAMUSCULAR | Status: DC | PRN
  Administered 2018-11-15 (×2): 50 ug via INTRAVENOUS

## 2018-11-15 MED ORDER — VASOPRESSIN 20 UNIT/ML IV SOLN
INTRAVENOUS | Status: AC
  Filled 2018-11-15: qty 1

## 2018-11-15 MED ORDER — HYDROMORPHONE 1 MG/ML IV SOLN
INTRAVENOUS | Status: DC
  Administered 2018-11-15: 1.2 mg via INTRAVENOUS
  Administered 2018-11-15: 1.94 mg via INTRAVENOUS
  Administered 2018-11-15: 30 mg via INTRAVENOUS
  Administered 2018-11-16: 0.9 mg via INTRAVENOUS
  Administered 2018-11-16: 1.2 mg via INTRAVENOUS
  Administered 2018-11-16: 6.2 mg via INTRAVENOUS
  Filled 2018-11-15: qty 30

## 2018-11-15 MED ORDER — NALOXONE HCL 0.4 MG/ML IJ SOLN: 0.4000 mg | INTRAMUSCULAR | Status: DC | PRN

## 2018-11-15 MED ORDER — MIDAZOLAM HCL 2 MG/2ML IJ SOLN
INTRAMUSCULAR | Status: AC
  Filled 2018-11-15: qty 2

## 2018-11-15 MED ORDER — SODIUM CHLORIDE (PF) 0.9 % IJ SOLN
INTRAMUSCULAR | Status: AC
  Filled 2018-11-15: qty 50

## 2018-11-15 MED ORDER — ONDANSETRON HCL 4 MG/2ML IJ SOLN
4.0000 mg | Freq: Four times a day (QID) | INTRAMUSCULAR | Status: DC | PRN
  Administered 2018-11-15: 4 mg via INTRAVENOUS

## 2018-11-15 MED ORDER — GABAPENTIN 300 MG PO CAPS
300.0000 mg | ORAL_CAPSULE | Freq: Two times a day (BID) | ORAL | Status: DC
  Administered 2018-11-15 – 2018-11-17 (×6): 300 mg via ORAL
  Filled 2018-11-15 (×8): qty 1

## 2018-11-15 MED ORDER — ACETAMINOPHEN 160 MG/5ML PO SOLN: 325.0000 mg | ORAL | Status: DC | PRN

## 2018-11-15 MED ORDER — DEXAMETHASONE SODIUM PHOSPHATE 10 MG/ML IJ SOLN
INTRAMUSCULAR | Status: AC
  Filled 2018-11-15: qty 1

## 2018-11-15 MED ORDER — ACETAMINOPHEN 325 MG PO TABS: 325.0000 mg | ORAL_TABLET | ORAL | Status: DC | PRN

## 2018-11-15 MED ORDER — ROCURONIUM BROMIDE 100 MG/10ML IV SOLN
INTRAVENOUS | Status: DC | PRN
  Administered 2018-11-15: 10 mg via INTRAVENOUS
  Administered 2018-11-15: 40 mg via INTRAVENOUS

## 2018-11-15 MED ORDER — LIDOCAINE HCL (CARDIAC) PF 100 MG/5ML IV SOSY
PREFILLED_SYRINGE | INTRAVENOUS | Status: AC
  Filled 2018-11-15: qty 5

## 2018-11-15 MED ORDER — PANTOPRAZOLE SODIUM 40 MG PO TBEC
40.0000 mg | DELAYED_RELEASE_TABLET | Freq: Every day | ORAL | Status: DC
  Administered 2018-11-15 – 2018-11-17 (×3): 40 mg via ORAL
  Filled 2018-11-15 (×3): qty 1

## 2018-11-15 MED ORDER — ONDANSETRON HCL 4 MG PO TABS: 4.0000 mg | ORAL_TABLET | Freq: Four times a day (QID) | ORAL | Status: DC | PRN

## 2018-11-15 MED ORDER — ONDANSETRON HCL 4 MG/2ML IJ SOLN
INTRAMUSCULAR | Status: AC
  Filled 2018-11-15: qty 2

## 2018-11-15 MED ORDER — MAGNESIUM CITRATE PO SOLN
1.0000 | Freq: Once | ORAL | Status: DC | PRN
  Filled 2018-11-15: qty 296

## 2018-11-15 MED ORDER — ONDANSETRON HCL 4 MG/2ML IJ SOLN
INTRAMUSCULAR | Status: DC | PRN
  Administered 2018-11-15: 4 mg via INTRAVENOUS

## 2018-11-15 MED ORDER — FENTANYL CITRATE (PF) 100 MCG/2ML IJ SOLN
INTRAMUSCULAR | Status: DC | PRN
  Administered 2018-11-15: 100 ug via INTRAVENOUS
  Administered 2018-11-15 (×4): 50 ug via INTRAVENOUS

## 2018-11-15 MED ORDER — OXYCODONE-ACETAMINOPHEN 5-325 MG PO TABS: 2.0000 | ORAL_TABLET | ORAL | Status: DC | PRN

## 2018-11-15 MED ORDER — CEFAZOLIN SODIUM-DEXTROSE 2-4 GM/100ML-% IV SOLN
INTRAVENOUS | Status: AC
  Filled 2018-11-15: qty 100

## 2018-11-15 MED ORDER — LACTATED RINGERS IV SOLN
INTRAVENOUS | Status: DC
  Administered 2018-11-15: 19:00:00 via INTRAVENOUS

## 2018-11-15 MED ORDER — SCOPOLAMINE 1 MG/3DAYS TD PT72
1.0000 | MEDICATED_PATCH | Freq: Once | TRANSDERMAL | Status: DC
  Administered 2018-11-15: 1.5 mg via TRANSDERMAL

## 2018-11-15 MED ORDER — MIDAZOLAM HCL 2 MG/2ML IJ SOLN
INTRAMUSCULAR | Status: DC | PRN
  Administered 2018-11-15 (×2): 1 mg via INTRAVENOUS

## 2018-11-15 MED ORDER — LACTATED RINGERS IV SOLN
INTRAVENOUS | Status: DC
  Administered 2018-11-15: 09:00:00 via INTRAVENOUS
  Administered 2018-11-15: 125 mL/h via INTRAVENOUS

## 2018-11-15 MED ORDER — ALUM & MAG HYDROXIDE-SIMETH 200-200-20 MG/5ML PO SUSP: 30.0000 mL | ORAL | Status: DC | PRN

## 2018-11-15 MED ORDER — MEPERIDINE HCL 25 MG/ML IJ SOLN: 6.2500 mg | INTRAMUSCULAR | Status: DC | PRN

## 2018-11-15 MED ORDER — LIDOCAINE HCL (CARDIAC) PF 100 MG/5ML IV SOSY
PREFILLED_SYRINGE | INTRAVENOUS | Status: DC | PRN
  Administered 2018-11-15: 90 mg via INTRAVENOUS

## 2018-11-15 MED ORDER — DIPHENHYDRAMINE HCL 50 MG/ML IJ SOLN: 12.5000 mg | Freq: Four times a day (QID) | INTRAMUSCULAR | Status: DC | PRN

## 2018-11-15 SURGICAL SUPPLY — 40 items
BENZOIN TINCTURE PRP APPL 2/3 (GAUZE/BANDAGES/DRESSINGS) ×2 IMPLANT
CANISTER SUCT 3000ML PPV (MISCELLANEOUS) ×2 IMPLANT
CELLS DAT CNTRL 66122 CELL SVR (MISCELLANEOUS) IMPLANT
CONT PATH 16OZ SNAP LID 3702 (MISCELLANEOUS) ×2 IMPLANT
DECANTER SPIKE VIAL GLASS SM (MISCELLANEOUS) ×2 IMPLANT
DRAPE CESAREAN BIRTH W POUCH (DRAPES) ×2 IMPLANT
DRSG OPSITE POSTOP 4X10 (GAUZE/BANDAGES/DRESSINGS) ×2 IMPLANT
DURAPREP 26ML APPLICATOR (WOUND CARE) ×2 IMPLANT
GAUZE 4X4 16PLY RFD (DISPOSABLE) ×2 IMPLANT
GLOVE BIOGEL PI IND STRL 7.0 (GLOVE) ×3 IMPLANT
GLOVE BIOGEL PI INDICATOR 7.0 (GLOVE) ×3
GLOVE ECLIPSE 7.0 STRL STRAW (GLOVE) ×2 IMPLANT
GOWN STRL REUS W/TWL LRG LVL3 (GOWN DISPOSABLE) ×4 IMPLANT
NEEDLE HYPO 22GX1.5 SAFETY (NEEDLE) ×4 IMPLANT
NS IRRIG 1000ML POUR BTL (IV SOLUTION) ×2 IMPLANT
PACK ABDOMINAL GYN (CUSTOM PROCEDURE TRAY) ×2 IMPLANT
PAD OB MATERNITY 4.3X12.25 (PERSONAL CARE ITEMS) ×2 IMPLANT
PENCIL SMOKE EVAC W/HOLSTER (ELECTROSURGICAL) ×2 IMPLANT
PROTECTOR NERVE ULNAR (MISCELLANEOUS) ×2 IMPLANT
RTRCTR WOUND ALEXIS 18CM MED (MISCELLANEOUS)
STAPLER VISISTAT 35W (STAPLE) IMPLANT
STRIP CLOSURE SKIN 1/2X4 (GAUZE/BANDAGES/DRESSINGS) ×2 IMPLANT
SUT PDS AB 0 CT1 27 (SUTURE) IMPLANT
SUT VIC AB 0 CT1 27 (SUTURE) ×5
SUT VIC AB 0 CT1 27XBRD ANBCTR (SUTURE) ×5 IMPLANT
SUT VIC AB 1 CTX 36 (SUTURE) ×2
SUT VIC AB 1 CTX36XBRD ANBCTRL (SUTURE) ×2 IMPLANT
SUT VIC AB 2-0 CT1 (SUTURE) IMPLANT
SUT VIC AB 2-0 SH 27 (SUTURE) ×1
SUT VIC AB 2-0 SH 27XBRD (SUTURE) ×1 IMPLANT
SUT VIC AB 3-0 PS2 18 (SUTURE) ×2
SUT VIC AB 3-0 PS2 18XBRD (SUTURE) ×2 IMPLANT
SUT VIC AB 3-0 SH 18 (SUTURE) ×2 IMPLANT
SUT VIC AB 3-0 SH 27 (SUTURE) ×1
SUT VIC AB 3-0 SH 27X BRD (SUTURE) ×1 IMPLANT
SUT VIC AB 4-0 KS 27 (SUTURE) ×2 IMPLANT
SUT VICRYL 0 TIES 12 18 (SUTURE) IMPLANT
SYR CONTROL 10ML LL (SYRINGE) ×4 IMPLANT
TOWEL OR 17X24 6PK STRL BLUE (TOWEL DISPOSABLE) ×4 IMPLANT
TRAY FOLEY W/BAG SLVR 14FR (SET/KITS/TRAYS/PACK) ×2 IMPLANT

## 2018-11-15 NOTE — Op Note (Signed)
Barbara Mcclain PROCEDURE DATE: 11/15/2018  PREOPERATIVE DIAGNOSIS: Symptomatic large uterine leiomyoma. POSTOPERATIVE DIAGNOSIS: The same PROCEDURE: Abdominal Myomectomy SURGEON:  Dr. [name] ASSISTANT: Dr. Clovia Cuff  INDICATIONS: 32 y.o. G2P0020 here for abdominal myomectomy for a large symptomatic uterine leiomyoma.  Risks of surgery were discussed with the patient including but not limited to: bleeding which may require transfusion or reoperation and hysterectomy (3-4% risk of intraoperative or psotoperative conversion to hysterectomy); infection which may require antibiotics; injury to bowel, bladder, ureters or other surrounding organs; need for additional procedures; formation of intraperitoneal adhesions that may lead to other complications or make future surgeries more difficult;  thromboembolic phenomenon, incisional problems and other postoperative/anesthesia complications.  Also discussed possible need for cesarean deliveries at 61 - [redacted] weeks gestation for subsequent pregnancies if the endometrial cavity is breached, and possible recurrence of fibroids as 25% of women who undergo this procedure require further treatment. Written informed consent was obtained.    FINDINGS:  Large 13 x 14 cm uterine leiomyoma weighing over 500 g in OR. Rubbery and soft, possible degeneration of central portion of fibroid.  Fibroid encompassed much of left side of uterus in all layers and included endometrium which was definitely breached in a large defect.  ANESTHESIA:    General INTRAVENOUS FLUIDS:1700  ml ESTIMATED BLOOD LOSS:1100 ml URINE OUTPUT: 100 ml SPECIMENS: Leiomyoma sent to pathology COMPLICATIONS: None immediate  PROCEDURE IN DETAIL:  The patient received intravenous preoperative antibiotics approximately 30 minutes prior to procedure.  She was then taken to the operating room and general anesthesia was administered without difficulty and SCDs were placed on the lower  extremities.   She was placed in dorsal supine position and prepped and draped in a sterile manner.  A Foley catheter was inserted into the bladder and attached to constant drainage.  After an adequate timeout was performed, attention was then turned to the abdomen where a Pfannenstiel incision was made with a scalpel.  This incision was carried down to the fascia using electrocautery.  The fascia was incised in the midline and this incision was extended bilaterally using the Mayo scissors.  Kocher's were applied to the superior aspect of the fascial incision, and the rectus muscles were dissected off bluntly and sharply using the Mayo scissors.  A similar process was carried out at the inferior aspect of the incision.  The rectus muscles were separated in the midline bluntly and the peritoneum was entered bluntly, with good visualization of the bladder and bowel.  Attention was then turned to the uterus where the myoma was noted.  The uterus was then delivered up through the incision.  Vasopressin solution was injected over the surface of the leiomyoma to aid with hemostasis.   A vertical incision was made over the uterine leiomyoma into the leiomyoma, and the capsule was recognized.  Using blunt methods, the leiomyoma was freed from the surrounding myometrial and endometrial tissue and removed intact.  There was significant blood loss from this process given the amount of blood flow to this lesion. The incision was closed in multiple layers, using 3-0 Vicryl initially to approximate the endometrial layer carefully, with care given not to block the bilateral ostia. Then  0 Vicryl running interlocking stitches were used in multiple layers to reapproximate the myometrium and to provide needed hemostasis. Redundant serosal tissue was excised, and the serosa was reapproximated using a 3-0 Vicryl baseball stitch.  Overall good hemostasis was noted. Surgicel was placed over the closed hysterotomy to help with  further  postoperative hemostasis.  The uterus was returned to the abdomen.  The peritoneum was approximated using 3-0 Vicryl interrupted stitches.  The fascia was reapproximated with 1 Vicryl running stitch.  The skin was closed with 3-0 Vicryl subcuticular stitch.  The patient tolerated the procedure well.  Sponge, lap, needle and instrument counts were correct x 3.  The patient was taken to the recovery room extubated and in stable condition.   [name], MD, [place], [place]

## 2018-11-15 NOTE — Anesthesia Procedure Notes (Signed)
Procedure Name: Intubation Date/Time: 11/15/2018 8:33 AM Performed by: Asher Muir, CRNA Pre-anesthesia Checklist: Patient identified, Emergency Drugs available, Suction available and Patient being monitored Patient Re-evaluated:Patient Re-evaluated prior to induction Oxygen Delivery Method: Circle system utilized and Simple face mask Preoxygenation: Pre-oxygenation with 100% oxygen Induction Type: IV induction Ventilation: Mask ventilation without difficulty Laryngoscope Size: Mac and 3 Grade View: Grade II Tube type: Oral Tube size: 7.0 mm Number of attempts: 1 Airway Equipment and Method: Stylet Placement Confirmation: ETT inserted through vocal cords under direct vision,  positive ETCO2 and breath sounds checked- equal and bilateral Secured at: 20 (right lip) cm Tube secured with: Tape Dental Injury: Teeth and Oropharynx as per pre-operative assessment

## 2018-11-15 NOTE — Anesthesia Postprocedure Evaluation (Signed)
Anesthesia Post Note  Patient: Barbara Mcclain  Procedure(s) Performed: ABDOMINAL MYOMECTOMY (N/A Abdomen)     Patient location during evaluation: PACU Anesthesia Type: General Level of consciousness: awake and alert Pain management: pain level controlled Vital Signs Assessment: post-procedure vital signs reviewed and stable Respiratory status: spontaneous breathing, nonlabored ventilation, respiratory function stable and patient connected to nasal cannula oxygen Cardiovascular status: blood pressure returned to baseline and stable Postop Assessment: no apparent nausea or vomiting Anesthetic complications: no    Last Vitals:  Vitals:   11/15/18 1501 11/15/18 1543  BP: 116/83   Pulse: (!) 107   Resp: 16 13  Temp: 36.4 C   SpO2: 100% 100%    Last Pain:  Vitals:   11/15/18 1543  TempSrc:   PainSc: 3    Pain Goal: Patients Stated Pain Goal: 3 (11/15/18 1543)               Olive Zmuda

## 2018-11-15 NOTE — Interval H&P Note (Signed)
History and Physical Interval Note 11/15/2018 7:55 AM  Barbara Mcclain  has presented today for surgery, with the diagnosis of Uterine Fibroid  The various methods of treatment have been discussed with the patient and family. After consideration of risks, benefits and other options for treatment, the patient has consented to  Procedure(s): ABDOMINAL MYOMECTOMY (N/A) as a surgical intervention .  The patient's history has been reviewed, patient examined, no change in status, stable for surgery.  I have reviewed the patient's chart and labs.  Questions were answered to the patient's satisfaction.  To OR when ready.    [name], MD, [place] for [facility], [place]

## 2018-11-15 NOTE — Plan of Care (Signed)
Pt admitted to room 302 for post op care and pain management

## 2018-11-15 NOTE — Transfer of Care (Signed)
Immediate Anesthesia Transfer of Care Note  Patient: Barbara Mcclain  Procedure(s) Performed: ABDOMINAL MYOMECTOMY (N/A Abdomen)  Patient Location: PACU  Anesthesia Type:General  Level of Consciousness: sedated  Airway & Oxygen Therapy: Patient Spontanous Breathing and Patient connected to nasal cannula oxygen  Post-op Assessment: Report given to RN and Post -op Vital signs reviewed and stable  Post vital signs: Reviewed and stable  Last Vitals:  Vitals Value Taken Time  BP 154/95 11/15/2018 10:20 AM  Temp    Pulse 94 11/15/2018 10:22 AM  Resp 19 11/15/2018 10:22 AM  SpO2 100 % 11/15/2018 10:22 AM  Vitals shown include unvalidated device data.  Last Pain:  Vitals:   11/15/18 0630  TempSrc: Oral  PainSc: 0-No pain      Patients Stated Pain Goal: 3 (71/06/26 9485)  Complications: No apparent anesthesia complications

## 2018-11-16 ENCOUNTER — Encounter (HOSPITAL_COMMUNITY): Payer: Self-pay | Admitting: Obstetrics & Gynecology

## 2018-11-16 DIAGNOSIS — D62 Acute posthemorrhagic anemia: Secondary | ICD-10-CM | POA: Diagnosis present

## 2018-11-16 LAB — CBC
HCT: 23.8 % — ABNORMAL LOW (ref 36.0–46.0)
HEMOGLOBIN: 8.1 g/dL — AB (ref 12.0–15.0)
MCH: 33.6 pg (ref 26.0–34.0)
MCHC: 34 g/dL (ref 30.0–36.0)
MCV: 98.8 fL (ref 80.0–100.0)
NRBC: 0.4 % — AB (ref 0.0–0.2)
Platelets: 293 10*3/uL (ref 150–400)
RBC: 2.41 MIL/uL — AB (ref 3.87–5.11)
RDW: 13.5 % (ref 11.5–15.5)
WBC: 8.9 10*3/uL (ref 4.0–10.5)

## 2018-11-16 LAB — PREPARE RBC (CROSSMATCH)

## 2018-11-16 MED ORDER — SODIUM CHLORIDE 0.9% IV SOLUTION
Freq: Once | INTRAVENOUS | Status: AC
  Administered 2018-11-16: 11:00:00 via INTRAVENOUS

## 2018-11-16 MED ORDER — DIPHENHYDRAMINE HCL 25 MG PO CAPS
25.0000 mg | ORAL_CAPSULE | Freq: Once | ORAL | Status: AC
  Administered 2018-11-16: 25 mg via ORAL
  Filled 2018-11-16: qty 1

## 2018-11-16 MED ORDER — OXYCODONE HCL 5 MG PO TABS
5.0000 mg | ORAL_TABLET | ORAL | Status: DC | PRN
  Administered 2018-11-16: 5 mg via ORAL
  Filled 2018-11-16: qty 1

## 2018-11-16 MED ORDER — FUROSEMIDE 10 MG/ML IJ SOLN
20.0000 mg | Freq: Once | INTRAMUSCULAR | Status: AC
  Administered 2018-11-16: 20 mg via INTRAVENOUS
  Filled 2018-11-16: qty 2

## 2018-11-16 MED ORDER — ACETAMINOPHEN 500 MG PO TABS
1000.0000 mg | ORAL_TABLET | Freq: Once | ORAL | Status: AC
  Administered 2018-11-16: 1000 mg via ORAL
  Filled 2018-11-16: qty 2

## 2018-11-16 NOTE — Plan of Care (Signed)
  Problem: Education: Goal: Knowledge of General Education information will improve Description Including pain rating scale, medication(s)/side effects and non-pharmacologic comfort measures Outcome: Progressing   Problem: Health Behavior/Discharge Planning: Goal: Ability to manage health-related needs will improve Outcome: Progressing   Problem: Clinical Measurements: Goal: Will remain free from infection Outcome: Progressing   Problem: Clinical Measurements: Goal: Respiratory complications will improve Outcome: Progressing   Problem: Nutrition: Goal: Adequate nutrition will be maintained Outcome: Progressing   

## 2018-11-16 NOTE — Progress Notes (Signed)
1 Day Post-Op Procedure(s) (LRB): ABDOMINAL MYOMECTOMY (N/A)  Subjective: Patient reports nausea, incisional pain, tolerating PO and no problems voiding.  No flatus yet. Had one episode of emesis this morning. Feels very weak and lightheaded especially during ambulation.  Objective: I have reviewed patient's vital signs, intake and output, medications and labs. Patient Vitals for the past 24 hrs:  BP Temp Temp src Pulse Resp SpO2 Height Weight  11/16/18 0745 121/78 98.6 F (37 C) Oral 95 15 100 % - -  11/16/18 0608 - - - - 16 100 % - -  11/16/18 0514 135/80 98.1 F (36.7 C) Oral 76 17 100 % - -  11/16/18 0331 - - - - 15 - - -  11/15/18 2304 125/79 98.2 F (36.8 C) Oral 92 17 100 % - -  11/15/18 2140 - - - - 17 - - -  11/15/18 1927 111/90 98.2 F (36.8 C) Oral 86 17 100 % - -  11/15/18 1755 - - - - 16 100 % - -  11/15/18 1645 114/72 (!) 97.4 F (36.3 C) Oral 98 18 100 % - -  11/15/18 1543 - - - - 13 100 % - -  11/15/18 1501 116/83 97.6 F (36.4 C) Oral (!) 107 16 100 % - -  11/15/18 1417 124/87 (!) 97.5 F (36.4 C) Oral (!) 101 14 100 % - -  11/15/18 1400 - - - - 14 100 % - -  11/15/18 1323 - - - - - - 5\' 6"  (1.676 m) 64.5 kg  11/15/18 1255 (!) 129/94 98 F (36.7 C) Oral (!) 109 16 100 % - -  11/15/18 1228 - - - - 18 100 % - -  11/15/18 1205 (!) 141/90 98 F (36.7 C) Oral 100 18 100 % - -  11/15/18 1145 (!) 142/93 - - 94 14 100 % - -  11/15/18 1130 (!) 135/102 - - 91 15 100 % - -  11/15/18 1115 (!) 145/101 - - 89 20 100 % - -  11/15/18 1100 (!) 143/91 - - 88 12 100 % - -  11/15/18 1045 (!) 150/95 - - 87 12 100 % - -  11/15/18 1030 (!) 152/84 97.7 F (36.5 C) - 89 16 100 % - -  11/15/18 1020 (!) 154/95 - - 98 13 100 % - -   General: alert and no distress Resp: clear to auscultation bilaterally Cardio: regular rate and rhythm GI: soft, minimal abdominal tenderness; bowel sounds normal; no masses,  no organomegaly and incision: old bloody drainage present Extremities:  extremities normal, atraumatic, no cyanosis or edema and Homans sign is negative, no sign of DVT Vaginal Bleeding: minimal   CBC Latest Ref Rng & Units 11/16/2018 11/15/2018 11/07/2018  WBC 4.0 - 10.5 K/uL 8.9 41.6(H) 5.9  Hemoglobin 12.0 - 15.0 g/dL 8.1(L) 10.9(L) 12.6  Hematocrit 36.0 - 46.0 % 23.8(L) 33.0(L) 38.3  Platelets 150 - 400 K/uL 293 362 343    Assessment: s/p Procedure(s): ABDOMINAL MYOMECTOMY (N/A): with symptomatic postoperative anemia  Plan: Discussed complexity of surgery in detail, increased EBL, concern about scarring in endometrium/effect of future fertility, definite need for cesarean deliveries in any pregnancies etc. Patient has symptomatic anemia, no sign of active bleeding on exam. Counseled about transfusion, she agreed. Will transfuse with 3 units of pRBCs.  Advance diet as tolerated Advance to PO medication for analgesia, K pad to be given to patient to use as needed. Continue inpatient observation.   LOS: 1  day    [name], MD 11/16/2018, 10:13 AM

## 2018-11-16 NOTE — Addendum Note (Signed)
Addendum  created 11/16/18 0838 by Georgeanne Nim, CRNA   Sign clinical note

## 2018-11-16 NOTE — Plan of Care (Signed)
  Problem: Education: Goal: Knowledge of the prescribed therapeutic regimen will improve Outcome: Progressing   

## 2018-11-16 NOTE — Progress Notes (Signed)
Abdominal  Dressing changed.  Steri strips changed due to old bloody drainage.  Incision well approximated with no active bleeding.  Patient tolerated without problems.

## 2018-11-16 NOTE — Anesthesia Postprocedure Evaluation (Signed)
Anesthesia Post Note  Patient: Barbara Mcclain  Procedure(s) Performed: ABDOMINAL MYOMECTOMY (N/A Abdomen)     Patient location during evaluation: [facility] Anesthesia Type: General Level of consciousness: awake Pain management: pain level controlled Vital Signs Assessment: post-procedure vital signs reviewed and stable Respiratory status: spontaneous breathing Cardiovascular status: stable Postop Assessment: able to ambulate and no apparent nausea or vomiting Anesthetic complications: no    Last Vitals:  Vitals:   11/16/18 0608 11/16/18 0745  BP:  121/78  Pulse:  95  Resp: 16 15  Temp:  37 C  SpO2: 100% 100%    Last Pain:  Vitals:   11/16/18 0819  TempSrc:   PainSc: 2    Pain Goal: Patients Stated Pain Goal: 3 (11/15/18 2003)               Everette Rank

## 2018-11-17 LAB — TYPE AND SCREEN
ABO/RH(D): A POS
Antibody Screen: POSITIVE
DONOR AG TYPE: NEGATIVE
Donor AG Type: NEGATIVE
Donor AG Type: NEGATIVE
Unit division: 0
Unit division: 0
Unit division: 0

## 2018-11-17 LAB — CBC
HCT: 36.1 % (ref 36.0–46.0)
Hemoglobin: 11.8 g/dL — ABNORMAL LOW (ref 12.0–15.0)
MCH: 31.1 pg (ref 26.0–34.0)
MCHC: 32.7 g/dL (ref 30.0–36.0)
MCV: 95 fL (ref 80.0–100.0)
Platelets: 181 10*3/uL (ref 150–400)
RBC: 3.8 MIL/uL — ABNORMAL LOW (ref 3.87–5.11)
RDW: 17.2 % — ABNORMAL HIGH (ref 11.5–15.5)
WBC: 10.6 10*3/uL — ABNORMAL HIGH (ref 4.0–10.5)
nRBC: 0 % (ref 0.0–0.2)

## 2018-11-17 LAB — BPAM RBC
BLOOD PRODUCT EXPIRATION DATE: 201912292359
Blood Product Expiration Date: 201912282359
Blood Product Expiration Date: 201912292359
ISSUE DATE / TIME: 201912111111
ISSUE DATE / TIME: 201912111408
ISSUE DATE / TIME: 201912111634
Unit Type and Rh: 5100
Unit Type and Rh: 5100
Unit Type and Rh: 9500

## 2018-11-17 LAB — BASIC METABOLIC PANEL
Anion gap: 7 (ref 5–15)
BUN: 7 mg/dL (ref 6–20)
CO2: 24 mmol/L (ref 22–32)
Calcium: 8.2 mg/dL — ABNORMAL LOW (ref 8.9–10.3)
Chloride: 107 mmol/L (ref 98–111)
Creatinine, Ser: 0.81 mg/dL (ref 0.44–1.00)
GFR calc Af Amer: >60 mL/min (ref >60)
GFR calc non Af Amer: >60 mL/min (ref >60)
Glucose, Bld: 82 mg/dL (ref 70–99)
Potassium: 3.7 mmol/L (ref 3.5–5.1)
Sodium: 138 mmol/L (ref 135–145)

## 2018-11-17 MED ORDER — IBUPROFEN 600 MG PO TABS: 600.0000 mg | ORAL_TABLET | Freq: Four times a day (QID) | ORAL | 0 refills | Status: DC | PRN

## 2018-11-17 MED ORDER — OXYCODONE-ACETAMINOPHEN 5-325 MG PO TABS: 1.0000 | ORAL_TABLET | Freq: Four times a day (QID) | ORAL | 0 refills | Status: DC | PRN

## 2018-11-17 MED ORDER — DOCUSATE SODIUM 100 MG PO CAPS: 100.0000 mg | ORAL_CAPSULE | Freq: Two times a day (BID) | ORAL | 2 refills | Status: DC | PRN

## 2018-11-17 MED FILL — IBUPROFEN 600 MG TABLET: 600 | 7 days supply | Qty: 30 | Fill #0

## 2018-11-17 NOTE — Discharge Summary (Signed)
Gynecology Physician Postoperative Discharge Summary  Patient ID: Barbara Mcclain MRN: [id number] DOB/AGE: 1986/01/20 32 y.o.  Admit Date: 11/15/2018 Discharge Date: 11/17/2018  Preoperative Diagnoses: Symptomatic large uterine leiomyoma.  Procedures: Procedure(s) (LRB): ABDOMINAL MYOMECTOMY (N/A) Transfusion of 3 units of pRBCs  Discharge Diagnosis:  Status post surgery, symptomatic postoperative anemia  Hospital Course:  Barbara Mcclain is a 32 y.o. [id number]  admitted for scheduled surgery.  She underwent the procedures as mentioned above, her operation was complicated by significant blood loss. For further details about surgery, please refer to the operative report. Patient had symptomatic anemia after surgery, her hemoglobin went from 12.6 to 8.1. She was transfused with 3 units of pRBCs.  Her discharge hemoglobin was 11.8.  No further complications during her in-hospital  postoperative course. By time of discharge on POD#2, her pain was controlled on oral pain medications; she was ambulating, voiding without difficulty, tolerating regular diet and passing flatus. She was deemed stable for discharge to home.   Significant Labs: CBC Latest Ref Rng & Units 11/17/2018 11/16/2018 11/15/2018  WBC 4.0 - 10.5 K/uL 10.6(H) 8.9 41.6(H)  Hemoglobin 12.0 - 15.0 g/dL 11.8(L) 8.1(L) 10.9(L)  Hematocrit 36.0 - 46.0 % 36.1 23.8(L) 33.0(L)  Platelets 150 - 400 K/uL 181 293 362    Discharge Exam: Blood pressure 134/90, pulse 79, temperature 98.7 F (37.1 C), temperature source Oral, resp. rate 18, height 5\' 6"  (1.676 m), weight 64.5 kg, last menstrual period 10/24/2018, SpO2 100 %. General appearance: alert and no distress  Resp: clear to auscultation bilaterally  Cardio: regular rate and rhythm  GI: soft, non-tender; bowel sounds normal; no masses, no organomegaly.  Incision: C/D/I, no erythema, some old drainage noted Pelvic: scant blood on pad  Extremities: extremities normal,  atraumatic, no cyanosis or edema and Homans sign is negative, no sign of DVT  Discharged Condition: Stable  Disposition: Discharge disposition: 01-Home or Self Care        Allergies as of 11/17/2018   No Known Allergies     Medication List    STOP taking these medications   CLEAR EYES FOR DRY EYES OP   naproxen 500 MG tablet Commonly known as:  NAPROSYN     TAKE these medications   docusate sodium 100 MG capsule Commonly known as:  COLACE Take 1 capsule (100 mg total) by mouth 2 (two) times daily as needed for mild constipation.   ferrous sulfate 325 (65 FE) MG tablet Take 1 tablet (325 mg total) by mouth 3 (three) times daily with meals. What changed:  when to take this   ibuprofen 600 MG tablet Commonly known as:  ADVIL,MOTRIN Take 1 tablet (600 mg total) by mouth every 6 (six) hours as needed for fever, headache, moderate pain or cramping.   megestrol 40 MG tablet Commonly known as:  MEGACE Take 1 tablet (40 mg total) by mouth daily.   oxyCODONE-acetaminophen 5-325 MG tablet Commonly known as:  PERCOCET/ROXICET Take 1-2 tablets by mouth every 6 (six) hours as needed for moderate pain or severe pain.   tranexamic acid 650 MG Tabs tablet Commonly known as:  LYSTEDA Take 1,300 mg by mouth See admin instructions. Take 1300 mg by mouth 3 times daily during cycle for 5 days      Follow-up Information    Anelis Hrivnak A, MD Follow up in 4 week(s).   Specialty:  Obstetrics and Gynecology Why:  Postop appointment. You will contacted with appointment details. Contact information: [address] [place] [address] [phone]  Signed:  [name], MD, [place] Attending [place], [employer]

## 2018-11-17 NOTE — Discharge Instructions (Signed)
Myomectomy, Care After °Refer to this sheet in the next few weeks. These instructions provide you with information on caring for yourself after your procedure. Your health care provider may also give you more specific instructions. Your treatment has been planned according to current medical practices, but problems sometimes occur. Call your health care provider if you have any problems or questions after your procedure. °What can I expect after the procedure? °After your procedure, it is typical to have the following: °· Pain in your abdomen, especially at any incision sites. You will be given pain medicine to control the pain. °· Tiredness. This is a normal part of the recovery process. Your energy level will return to normal over the next several weeks. °· Constipation. °· Vaginal bleeding. This is normal and should stop after 1-2 weeks. ° °Follow these instructions at home: °· Only take over-the-counter or prescription medicines as directed by your health care provider. Avoid aspirin because it can cause bleeding. °· Do not douche, use tampons, or have sexual intercourse until given permission by your health care provider. °· Remove or change any bandages (dressings) as directed by your health care provider. °· Take showers instead of baths as directed by your health care provider. °· You will probably be able to go back to your normal routine after a few days. Do not do anything that requires extra effort until your health care provider says it is okay. Do not lift anything heavier than 15 pounds (6.8 kg) until your health care provider approves. °· Walk daily but take frequent rest breaks if you tire easily. °· Continue to practice deep breathing and coughing. If it hurts to cough, try holding a pillow against your belly as you cough. °· If you become constipated, you may: °? Use a mild laxative if your health care provider approves. °? Add more fruit and bran to your diet. °? Drink enough fluids to keep your  urine clear or pale yellow. °· Take your temperature twice a day and write it down. °· Do not drink alcohol. °· Do not drive until your health care provider approves. °· Have someone help you at home for 1 week or until you can do your own household activities. °· Follow up with your health care provider as directed. °Contact a health care provider if: °· You have a fever. °· You have increasing abdominal pain that is not relieved with medicine. °· You have nausea, vomiting, or diarrhea. °· You have pain when you urinate, or you have blood in your urine. °· You have a rash on your body. °· You have pain or redness where your IV access tube was inserted. °· You have redness, swelling, or any kind of drainage from an incision. °Get help right away if: °· You have weakness or lightheadedness. °· You have pain, swelling, or redness in your legs. °· You have chest pain. °· You faint. °· You have shortness of breath. °· You have heavy vaginal bleeding. °· Your incision is opening up. °This information is not intended to replace advice given to you by your health care provider. Make sure you discuss any questions you have with your health care provider. °Document Released: 04/15/2011 Document Revised: 04/30/2016 Document Reviewed: 07/05/2013 °Elsevier Interactive Patient Education © 2017 [employer] ° °

## 2018-11-18 ENCOUNTER — Telehealth: Payer: Self-pay

## 2018-11-18 IMAGING — CT CT ABD-PELV W/ CM
2 of 4 series · 15 of 46 positions shown, 17 images · IV contrast (ISOVUE)
Comparison: None.

CLINICAL DATA: Acute onset of vaginal bleeding and generalized
fatigue.

EXAM:
CT ABDOMEN AND PELVIS WITH CONTRAST
TECHNIQUE: Multidetector CT imaging of the abdomen and pelvis was performed
using the standard protocol following bolus administration of
intravenous contrast.
CONTRAST:  100mL 8WXXIV-7FF IOPAMIDOL (8WXXIV-7FF) INJECTION 61%

[Series 2: axial st · axial · 0.71mm/px · z∈[-430,-50]mm · 12 of 88 slices shown, 14 images]
[im 6/88  soft-tissue]
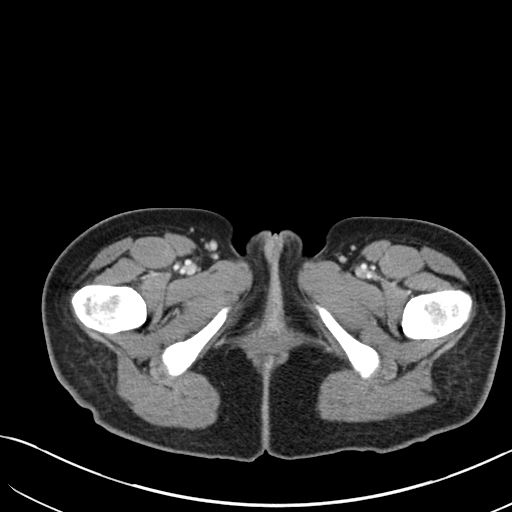
[im 6/88  bone]
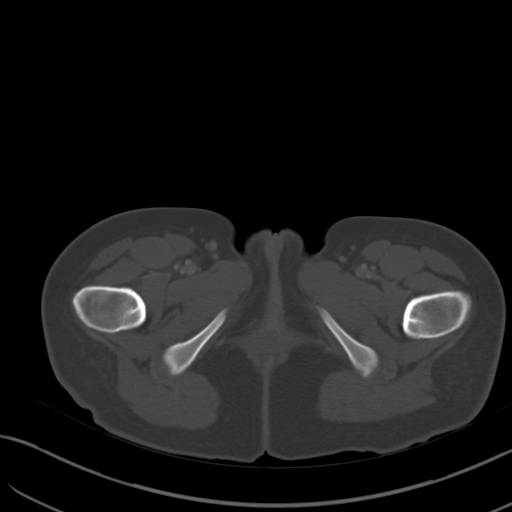
[im 11/88  soft-tissue]
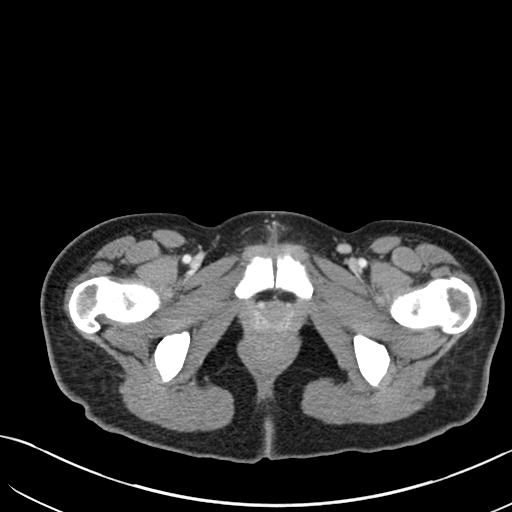
[im 22/88  soft-tissue]
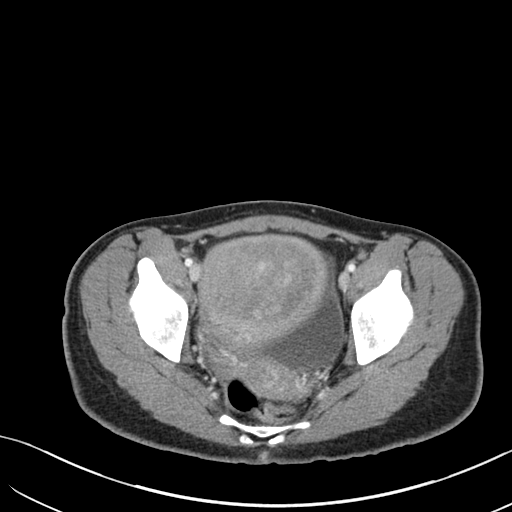
[im 28/88  soft-tissue]
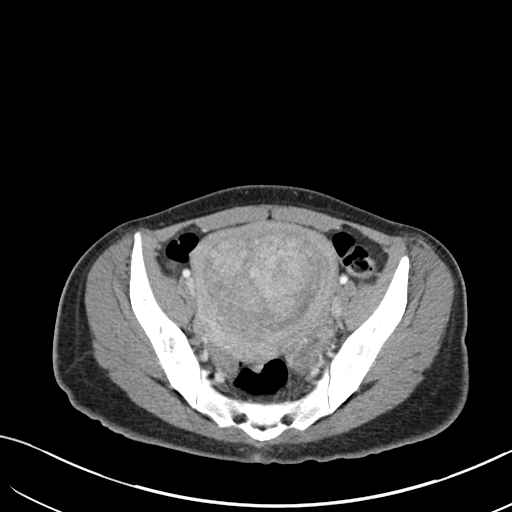
[im 33/88  soft-tissue]
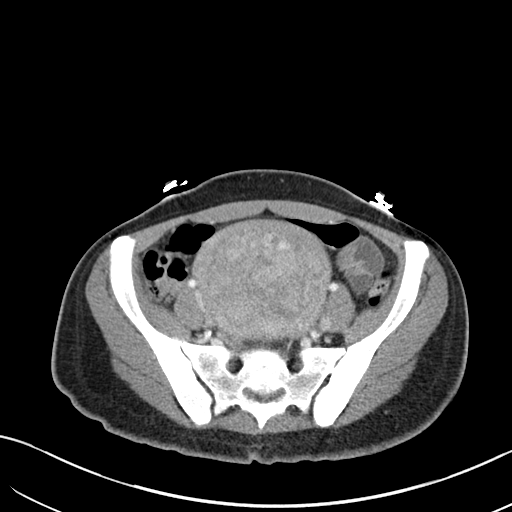
[im 39/88  soft-tissue]
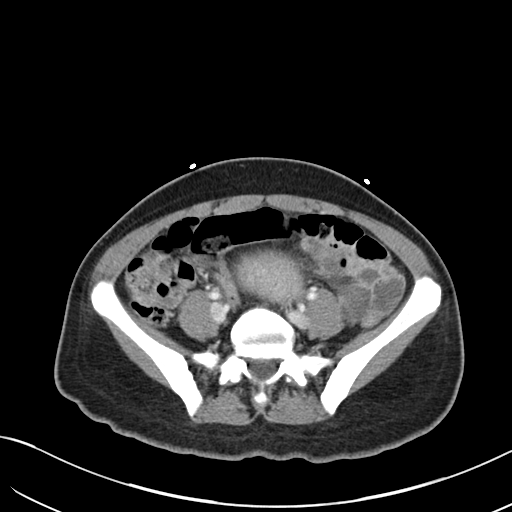
[im 49/88  soft-tissue]
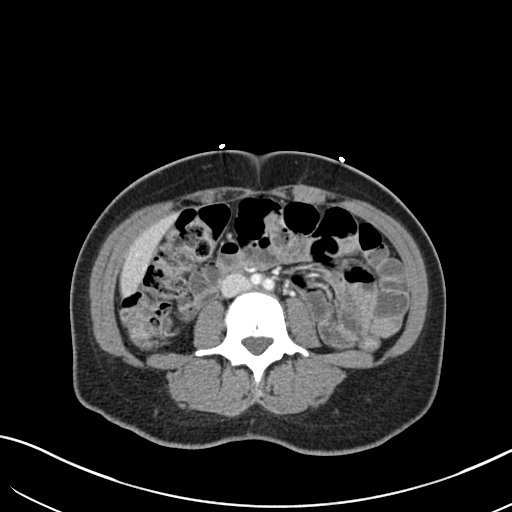
[im 55/88  soft-tissue]
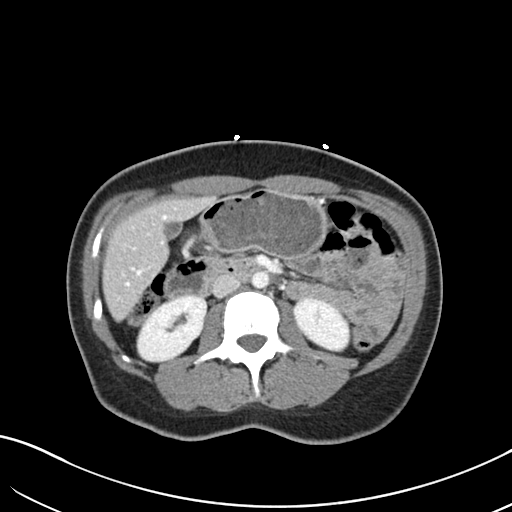
[im 60/88  soft-tissue]
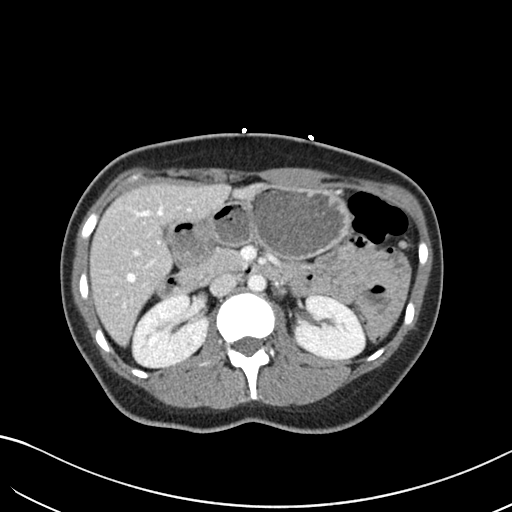
[im 60/88  bone]
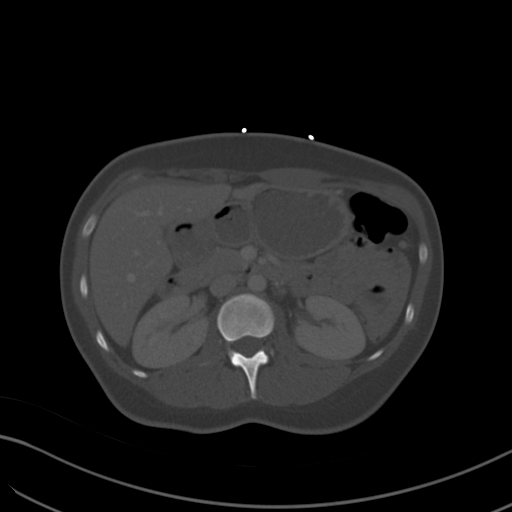
[im 66/88  soft-tissue]
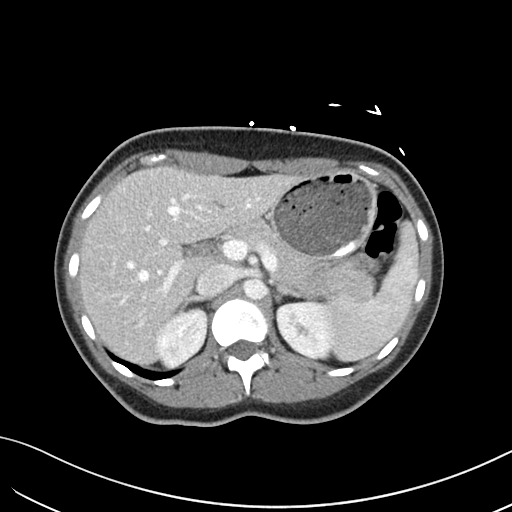
[im 77/88  soft-tissue]
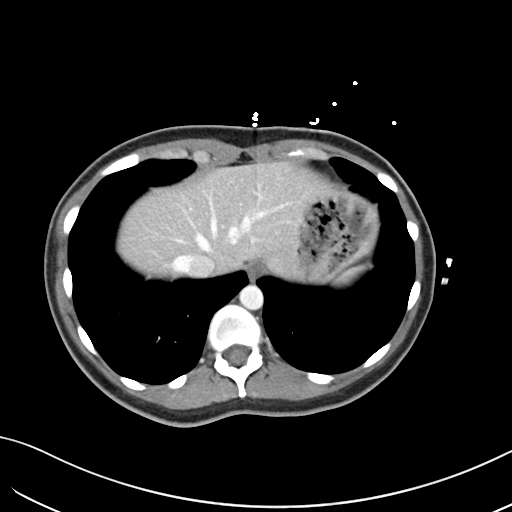
[im 82/88  soft-tissue]
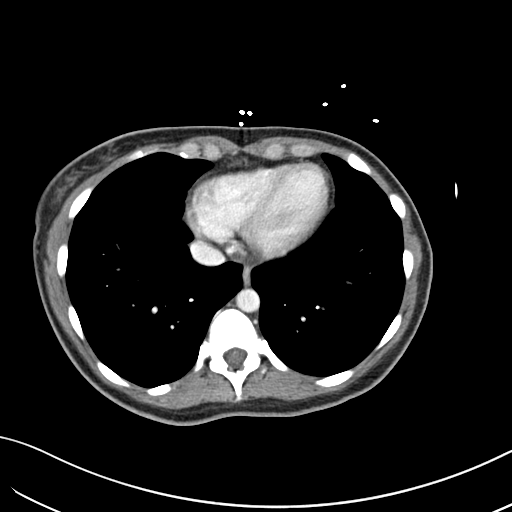

[Series 4: coronal st · coronal · 0.61mm/px · 3 of 84 slices shown]
[im 28/84  soft-tissue]
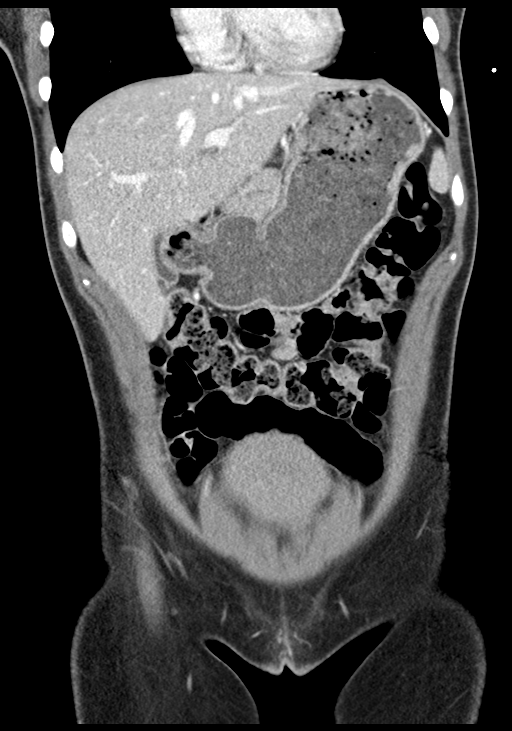
[im 37/84  soft-tissue]
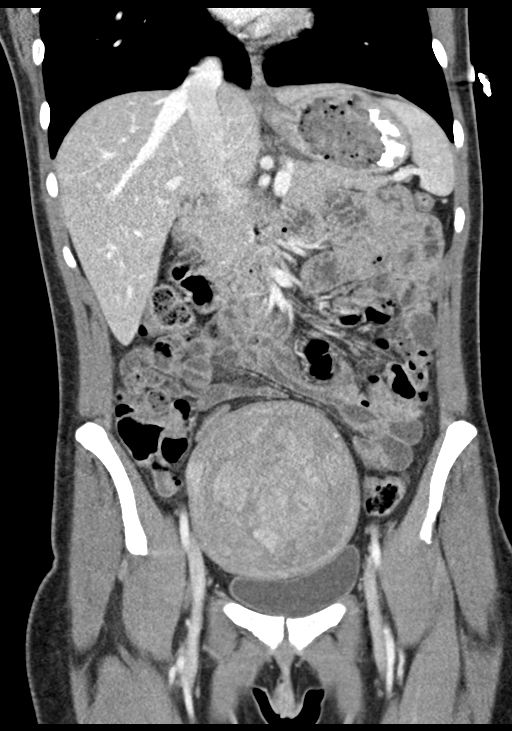
[im 47/84  soft-tissue]
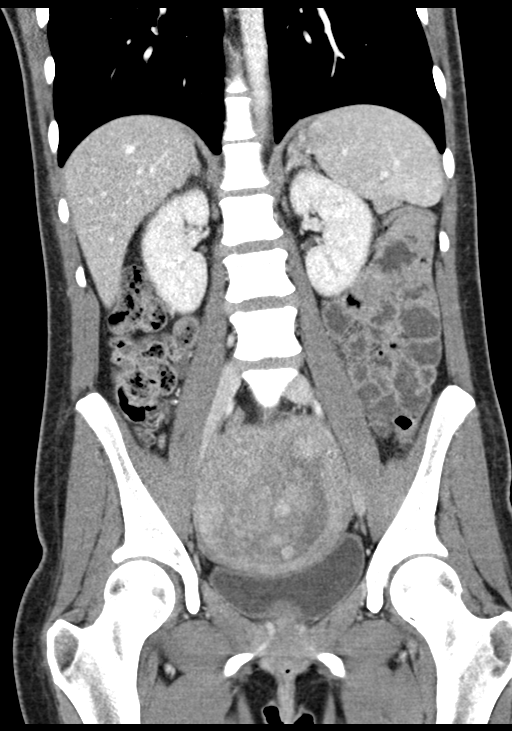

[15 of 46 positions shown; findings below may reference images not displayed]

FINDINGS: Lower chest: The visualized lung bases are grossly clear. The
visualized portions of the mediastinum are unremarkable.

Hepatobiliary: Scattered hypodensities within the liver measure up
to 6 mm in size. The gallbladder is grossly unremarkable. The common
bile duct remains normal in size.

Pancreas: The pancreas is within normal limits.

Spleen: The spleen is unremarkable in appearance.

Adrenals/Urinary Tract: The adrenal glands are unremarkable in
appearance. The kidneys are within normal limits. There is no
evidence of hydronephrosis. No renal or ureteral stones are
identified. No perinephric stranding is seen.

Stomach/Bowel: The stomach is unremarkable in appearance. The small
bowel is within normal limits. The appendix is normal in caliber,
without evidence of appendicitis. The colon is unremarkable in
appearance.

Vascular/Lymphatic: The abdominal aorta is unremarkable in
appearance. The inferior vena cava is grossly unremarkable. No
retroperitoneal lymphadenopathy is seen. No pelvic sidewall
lymphadenopathy is identified.

Reproductive: The bladder is mildly distended and grossly
unremarkable. A 10 cm heterogeneous mass is noted within the uterus.
Though this could reflect a degenerating fibroid, malignancy cannot
be excluded. MRI of the pelvis with contrast would be helpful for
further evaluation. Given its size, this would be difficult to
assess on ultrasound.

No suspicious adnexal masses are seen. The ovaries are relatively
symmetric.

Other: No additional soft tissue abnormalities are seen.

Musculoskeletal: No acute osseous abnormalities are identified. The
visualized musculature is unremarkable in appearance.
IMPRESSION: 1. 10 cm heterogeneous mass within the uterus. Though this could
reflect a degenerating fibroid, malignancy cannot be excluded. MRI
of the pelvis with contrast would be helpful for further evaluation.
Given its size, this would be difficult to assess on ultrasound.
2. Scattered nonspecific hypodensities within the liver measure up
to 6 mm in size. Would correlate with LFTs.

## 2018-11-18 NOTE — Telephone Encounter (Signed)
Attempted to return the pt's call regarding her medications.  The Percocet and Colace both show that they were sent and the receipt confirmed by the [facility].  Will send a mychart message to the pt.

## 2018-11-18 NOTE — Telephone Encounter (Signed)
Patient called stating that her Ibuprofen was sent to her pharmacy but the Colace and the oxyCODONE was not sent and she wanted to know where the Rx's were sent to. Patient stated that the provider was Dr.Anyawu.

## 2018-11-21 ENCOUNTER — Other Ambulatory Visit: Payer: Self-pay | Admitting: Obstetrics & Gynecology

## 2018-11-21 ENCOUNTER — Telehealth: Payer: Self-pay | Admitting: General Practice

## 2018-11-21 DIAGNOSIS — Z9889 Other specified postprocedural states: Secondary | ICD-10-CM

## 2018-11-21 MED ORDER — IBUPROFEN 600 MG PO TABS: 600.0000 mg | ORAL_TABLET | Freq: Four times a day (QID) | ORAL | 0 refills | Status: DC | PRN

## 2018-11-21 MED ORDER — DOCUSATE SODIUM 100 MG PO CAPS: 100.0000 mg | ORAL_CAPSULE | Freq: Two times a day (BID) | ORAL | 2 refills | Status: DC | PRN

## 2018-11-21 MED ORDER — OXYCODONE-ACETAMINOPHEN 5-325 MG PO TABS: 1.0000 | ORAL_TABLET | Freq: Four times a day (QID) | ORAL | 0 refills | Status: DC | PRN

## 2018-11-21 NOTE — Telephone Encounter (Signed)
Patient called and left message on nurse voicemail line stating she had surgery recently and is having a hard time getting her pain medication. Patient states she keeps getting the run around from [facility] and they cannot fill her Rx. Patient is requesting her prescription be sent to [facility] on [address].   Spoke with Dr Harolyn Rutherford & Rx has been sent. Called & informed patient. Patient verbalized understanding & had no questions.

## 2018-12-14 ENCOUNTER — Ambulatory Visit: Payer: Medicaid Other | Admitting: Obstetrics & Gynecology

## 2018-12-27 ENCOUNTER — Encounter: Payer: Self-pay | Admitting: Obstetrics & Gynecology

## 2018-12-27 ENCOUNTER — Ambulatory Visit: Payer: Medicaid Other | Admitting: Obstetrics & Gynecology

## 2018-12-27 VITALS — BP 110/78 | HR 106 | Wt 142.1 lb

## 2018-12-27 DIAGNOSIS — Z9889 Other specified postprocedural states: Secondary | ICD-10-CM

## 2018-12-27 NOTE — Progress Notes (Signed)
   Subjective:    Patient ID: Barbara Mcclain, female    DOB: 1986/08/09, 33 y.o.   MRN: [id number]  HPI 33 yo P0 here for her post op visit. She had a myomectomy done on 11/15/18. She has had no problems. She is currently having her normal heavy period. She has not had sex since surgery. She plans to use condoms for contraception until she can try to conceive.   Review of Systems Pathology showed a benign 10 cm fibroid.    Objective:   Physical Exam Breathing, conversing, and ambulating normally Well nourished, well hydrated Black female, no apparent distress Abd- benign, incision- healed well     Assessment & Plan:  Post op state- doing well I stressed the importance of not conceiving until at least 6 months from now.

## 2024-02-24 ENCOUNTER — Ambulatory Visit (INDEPENDENT_AMBULATORY_CARE_PROVIDER_SITE_OTHER): Payer: Medicaid Other | Admitting: Obstetrics and Gynecology

## 2024-02-24 ENCOUNTER — Encounter: Payer: Self-pay | Admitting: Obstetrics and Gynecology

## 2024-02-24 VITALS — BP 135/91 | HR 88 | Ht 66.0 in | Wt 197.0 lb

## 2024-02-24 DIAGNOSIS — N939 Abnormal uterine and vaginal bleeding, unspecified: Secondary | ICD-10-CM

## 2024-02-24 DIAGNOSIS — Z1231 Encounter for screening mammogram for malignant neoplasm of breast: Secondary | ICD-10-CM

## 2024-02-24 NOTE — Progress Notes (Signed)
 Pt referred from PCP for skipped cycles. Pt states LMP 02/07/24, pt had not had a cycle since December. Pt does not use BC.

## 2024-02-24 NOTE — Progress Notes (Signed)
   NEW GYNECOLOGY VISIT  Subjective:  Barbara Mcclain is a 38 y.o. G2P0020 with LMP 02/07/24 presenting for AUB  History of abdominal myomectomy in 2019 Presents after skipping 2 cycles.  Reports a cycle from December 24 through January 3, then no cycle in January or February.  Had a normal period that lasted 3 to 4 days starting on March 3.  Prior to this her cycles were regular lasting around 4 to 5 days.  She also notes a year of intermittent bloating and feeling gassy as well as a 20 pound weight gain.  She does not know any major life changes or events in January or February.  She does note some hot flashes and dry skin.  She denies acne or new hair growth.  She is not taking any hormonal medications.  Her only medications are amlodipine, iron, omeprazole.  She reports that she is up-to-date on her Pap smear through her PCP.  She reports she had a negative UPT with her primary.  Objective:   Vitals:   02/24/24 1059  BP: (!) 135/91  Pulse: 88  Weight: 197 lb (89.4 kg)  Height: 5\' 6"  (1.676 m)   General:  Alert, oriented and cooperative. Patient is in no acute distress.  Skin: Skin is warm and dry. No rash noted.   Cardiovascular: Normal heart rate noted  Respiratory: Normal respiratory effort, no problems with respiration noted  Abdomen: Soft, non-tender, non-distended   Pelvic: NEFG.  Uterus is normal size, mobile, nontender and retroverted.  No adnexal masses or tenderness.  Exam performed in the presence of a chaperone  Assessment and Plan:  Barbara Mcclain is a 38 y.o. with oligomenorrhea/AUB  Abnormal uterine bleeding (AUB) Normal exam today and overall reassuring history Will obtain serum labs to rule out systemic causes of AUB Plan for expectant management for now, but advised patient to notify us if she ever goes more than 2 to 3 months without a period so that we can do further testing -     TSH Rfx on Abnormal to Free T4 -     Prolactin -     FSH -      Estradiol  Breast cancer screening by mammogram -     MM 3D SCREENING MAMMOGRAM BILATERAL BREAST; Future  Return in about 6 months (around 08/26/2024) for annual exam or sooner as needed.   Lennart Pall, MD

## 2024-02-25 LAB — TSH RFX ON ABNORMAL TO FREE T4: TSH: 0.068 u[IU]/mL — ABNORMAL LOW (ref 0.450–4.500)

## 2024-02-25 LAB — PROLACTIN: Prolactin: 10.5 ng/mL (ref 4.8–33.4)

## 2024-02-25 LAB — FOLLICLE STIMULATING HORMONE: FSH: 8.9 m[IU]/mL

## 2024-02-25 LAB — T4F: T4,Free (Direct): 1.77 ng/dL (ref 0.82–1.77)

## 2024-02-25 LAB — ESTRADIOL: Estradiol: 54.5 pg/mL

## 2024-03-23 ENCOUNTER — Other Ambulatory Visit

## 2024-03-24 ENCOUNTER — Other Ambulatory Visit

## 2024-06-15 ENCOUNTER — Ambulatory Visit

## 2024-06-27 ENCOUNTER — Ambulatory Visit
